# Patient Record
Sex: Male | Born: 1955 | Race: White | Hispanic: No | Marital: Married
Health system: Midwestern US, Academic
[De-identification: ages and names within clinical notes are randomized; demographics above are authoritative.]

---

## 2018-01-22 ENCOUNTER — Encounter: Admit: 2018-01-22 | Discharge: 2018-01-22 | Payer: MEDICARE

## 2018-01-22 DIAGNOSIS — I340 Nonrheumatic mitral (valve) insufficiency: Principal | ICD-10-CM

## 2018-01-22 MED ORDER — CARVEDILOL 3.125 MG PO TAB
ORAL_TABLET | Freq: Two times a day (BID) | ORAL | 3 refills | 90.00000 days | Status: AC
Start: 2018-01-22 — End: 2020-07-06

## 2018-01-28 ENCOUNTER — Encounter: Admit: 2018-01-28 | Discharge: 2018-01-28 | Payer: MEDICARE

## 2018-01-28 MED ORDER — DOXYCYCLINE MONOHYDRATE 100 MG PO CAP
ORAL_CAPSULE | Freq: Two times a day (BID) | 2 refills | 10.00000 days | Status: AC
Start: 2018-01-28 — End: 2018-09-18

## 2018-03-14 ENCOUNTER — Encounter: Admit: 2018-03-14 | Discharge: 2018-03-14 | Payer: MEDICARE

## 2018-03-14 DIAGNOSIS — R7881 Bacteremia: ICD-10-CM

## 2018-03-14 DIAGNOSIS — K7290 Hepatic failure, unspecified without coma: Principal | ICD-10-CM

## 2018-03-14 DIAGNOSIS — M009 Pyogenic arthritis, unspecified: ICD-10-CM

## 2018-03-14 DIAGNOSIS — F1011 Alcohol abuse, in remission: ICD-10-CM

## 2018-03-14 DIAGNOSIS — M1990 Unspecified osteoarthritis, unspecified site: ICD-10-CM

## 2018-07-27 ENCOUNTER — Encounter: Admit: 2018-07-27 | Discharge: 2018-07-27 | Payer: MEDICARE

## 2018-09-17 ENCOUNTER — Encounter: Admit: 2018-09-17 | Discharge: 2018-09-17 | Payer: MEDICARE

## 2018-09-18 MED ORDER — DOXYCYCLINE MONOHYDRATE 100 MG PO CAP
ORAL_CAPSULE | Freq: Two times a day (BID) | 8 refills | 10.00000 days | Status: AC
Start: 2018-09-18 — End: 2019-09-23

## 2018-10-18 ENCOUNTER — Encounter: Admit: 2018-10-18 | Discharge: 2018-10-18 | Payer: MEDICARE

## 2018-10-18 DIAGNOSIS — I340 Nonrheumatic mitral (valve) insufficiency: Principal | ICD-10-CM

## 2018-10-21 MED ORDER — CARVEDILOL 3.125 MG PO TAB
ORAL_TABLET | Freq: Two times a day (BID) | 3 refills
Start: 2018-10-21 — End: ?

## 2018-11-16 ENCOUNTER — Encounter: Admit: 2018-11-16 | Discharge: 2018-11-16 | Payer: MEDICARE

## 2018-11-16 ENCOUNTER — Inpatient Hospital Stay: Admit: 2018-11-16 | Discharge: 2018-11-16 | Payer: MEDICARE

## 2018-11-16 DIAGNOSIS — K922 Gastrointestinal hemorrhage, unspecified: ICD-10-CM

## 2018-11-16 LAB — URINALYSIS DIPSTICK REFLEX TO CULTURE
Lab: NEGATIVE
Lab: NEGATIVE
Lab: NEGATIVE
Lab: NEGATIVE
Lab: NEGATIVE
Lab: NEGATIVE

## 2018-11-16 LAB — COMPREHENSIVE METABOLIC PANEL
Lab: 1 mg/dL (ref 0.4–1.24)
Lab: 1.7 mg/dL — ABNORMAL HIGH (ref 0.3–1.2)
Lab: 112 MMOL/L — ABNORMAL HIGH (ref 98–110)
Lab: 14 — ABNORMAL HIGH (ref 3–12)
Lab: 144 MMOL/L (ref 137–147)
Lab: 18 MMOL/L — ABNORMAL LOW (ref 21–30)
Lab: 18 U/L (ref 7–56)
Lab: 2.1 g/dL — ABNORMAL LOW (ref 3.5–5.0)
Lab: 24 U/L (ref 7–40)
Lab: 3.5 MMOL/L (ref 3.5–5.1)
Lab: 3.7 g/dL — ABNORMAL LOW (ref 6.0–8.0)
Lab: 7.2 mg/dL — ABNORMAL LOW (ref 8.5–10.6)
Lab: 84 U/L (ref 25–110)
Lab: >60 mL/min (ref >60)
Lab: >60 mL/min (ref >60)

## 2018-11-16 LAB — CBC AND DIFF
Lab: 0 % (ref 0–2)
Lab: 0 % (ref 0–5)
Lab: 0 10*3/uL (ref 0–0.20)
Lab: 0 10*3/uL (ref 0–0.45)
Lab: 0.9 10*3/uL — ABNORMAL HIGH (ref 0–0.80)
Lab: 1 10*3/uL (ref 1.0–4.8)
Lab: 14 10*3/uL — ABNORMAL HIGH (ref 1.8–7.0)
Lab: 16 K/UL — ABNORMAL HIGH (ref 4.5–11.0)
Lab: 169 10*3/uL (ref 150–400)
Lab: 17 % — ABNORMAL HIGH (ref 11–15)
Lab: 2.6 M/UL — ABNORMAL LOW (ref 4.4–5.5)
Lab: 24 % — ABNORMAL LOW (ref 40–50)
Lab: 32 pg (ref 26–34)
Lab: 6 % (ref 4–12)
Lab: 6 % — ABNORMAL LOW (ref 24–44)
Lab: 7.5 FL (ref 7–11)
Lab: 88 % — ABNORMAL HIGH (ref 41–77)

## 2018-11-16 LAB — PROTIME INR (PT): Lab: 1.7 — ABNORMAL HIGH (ref 0.8–1.2)

## 2018-11-16 LAB — MAGNESIUM: Lab: 1.7 mg/dL — ABNORMAL HIGH (ref 1.6–2.6)

## 2018-11-16 LAB — BLOOD GASES, PERIPHERAL VENOUS
Lab: 1 MMOL/L
Lab: 16 mmHg — ABNORMAL LOW (ref 33–48)
Lab: 25 mmHg — ABNORMAL LOW (ref 36–50)
Lab: 7.5 — ABNORMAL HIGH (ref 7.30–7.40)

## 2018-11-16 LAB — PTT (APTT): Lab: 33 s (ref 24.0–36.5)

## 2018-11-16 LAB — URINALYSIS MICROSCOPIC REFLEX TO CULTURE

## 2018-11-16 LAB — TROPONIN-I: Lab: 0 ng/mL (ref 0.0–0.05)

## 2018-11-16 LAB — PHOSPHORUS: Lab: 2.7 mg/dL — ABNORMAL HIGH (ref 2.0–4.5)

## 2018-11-16 LAB — AMMONIA: Lab: 48 umol/L — ABNORMAL HIGH (ref 9–35)

## 2018-11-16 LAB — LACTIC ACID (BG - RAPID LACTATE): Lab: 4.6 MMOL/L — ABNORMAL HIGH (ref 0.5–2.0)

## 2018-11-16 LAB — PROCALCITONIN: Lab: 0.2 ng/mL — ABNORMAL HIGH (ref <0.11)

## 2018-11-16 MED ORDER — PHYTONADIONE IVPB
10 mg | Freq: Every day | INTRAVENOUS | 0 refills | Status: CP
Start: 2018-11-16 — End: ?
  Administered 2018-11-16 – 2018-11-18 (×6): 10 mg via INTRAVENOUS

## 2018-11-16 MED ORDER — PANTOPRAZOLE IV INFUSION
8 mg/h | INTRAVENOUS | 0 refills | Status: AC
Start: 2018-11-16 — End: ?
  Administered 2018-11-16 – 2018-11-19 (×14): 8 mg/h via INTRAVENOUS

## 2018-11-16 MED ORDER — LACTATED RINGERS IV SOLP
500 mL | INTRAVENOUS | 0 refills | Status: CP
Start: 2018-11-16 — End: ?
  Administered 2018-11-16: 23:00:00 500 mL via INTRAVENOUS

## 2018-11-16 MED ORDER — POTASSIUM CHLORIDE IN WATER 10 MEQ/50 ML IV PGBK
10 meq | INTRAVENOUS | 0 refills | Status: CP
Start: 2018-11-16 — End: ?
  Administered 2018-11-17 (×4): 10 meq via INTRAVENOUS

## 2018-11-16 MED ORDER — ALBUMIN, HUMAN 25 % IV SOLP
25 g | INTRAVENOUS | 0 refills | Status: CP
Start: 2018-11-16 — End: ?
  Administered 2018-11-17 (×4): 25 g via INTRAVENOUS

## 2018-11-16 MED ORDER — BANANA BAG (~~LOC~~) 1000ML
Freq: Every day | INTRAVENOUS | 0 refills | Status: CP
Start: 2018-11-16 — End: ?
  Administered 2018-11-17 – 2018-11-18 (×12): 1001.200 mL via INTRAVENOUS

## 2018-11-16 MED ORDER — LACTULOSE 10 GRAM/15 ML PO SOLN
20 g | Freq: Three times a day (TID) | ORAL | 0 refills | Status: DC
Start: 2018-11-16 — End: 2018-11-22
  Administered 2018-11-17 – 2018-11-22 (×2): 20 g via ORAL

## 2018-11-16 MED ORDER — PANTOPRAZOLE 40 MG IV SOLR
80 mg | INTRAVENOUS | 0 refills | Status: CP
Start: 2018-11-16 — End: ?
  Administered 2018-11-16: 22:00:00 80 mg via INTRAVENOUS

## 2018-11-16 MED ORDER — OCTREOTIDE IV DRIP
50 ug/h | INTRAVENOUS | 0 refills | Status: DC
Start: 2018-11-16 — End: 2018-11-17
  Administered 2018-11-16 – 2018-11-17 (×6): 50 ug/h via INTRAVENOUS

## 2018-11-16 MED ORDER — FOLIC ACID 1 MG PO TAB
1 mg | Freq: Every day | ORAL | 0 refills | Status: DC
Start: 2018-11-16 — End: 2018-11-22
  Administered 2018-11-19 – 2018-11-22 (×4): 1 mg via ORAL

## 2018-11-16 MED ORDER — OCTREOTIDE (SANDOSTATIN) BOLUS FOR CONTINUOUS INFUSION
50 ug | Freq: Once | INTRAVENOUS | 0 refills | Status: CP
Start: 2018-11-16 — End: ?

## 2018-11-16 MED ORDER — CEFTRIAXONE INJ 1GM IVP
1 g | INTRAVENOUS | 0 refills | Status: DC
Start: 2018-11-16 — End: 2018-11-17
  Administered 2018-11-16: 22:00:00 1 g via INTRAVENOUS

## 2018-11-16 MED ORDER — THIAMINE MONONITRATE (VIT B1) 100 MG PO TAB
100 mg | Freq: Every day | ORAL | 0 refills | Status: DC
Start: 2018-11-16 — End: 2018-11-21
  Administered 2018-11-19 – 2018-11-21 (×3): 100 mg via ORAL

## 2018-11-16 MED ORDER — RIFAXIMIN 550 MG PO TAB
550 mg | Freq: Two times a day (BID) | ORAL | 0 refills | Status: DC
Start: 2018-11-16 — End: 2018-11-16

## 2018-11-17 ENCOUNTER — Encounter: Admit: 2018-11-17 | Discharge: 2018-11-17 | Payer: MEDICARE

## 2018-11-17 ENCOUNTER — Inpatient Hospital Stay: Admit: 2018-11-17 | Discharge: 2018-11-17 | Payer: MEDICARE

## 2018-11-17 DIAGNOSIS — K922 Gastrointestinal hemorrhage, unspecified: ICD-10-CM

## 2018-11-17 LAB — PROTIME INR (PT): Lab: 2 mg/dL — ABNORMAL HIGH (ref <150)

## 2018-11-17 LAB — HEMOGLOBIN & HEMATOCRIT
Lab: 14 % — ABNORMAL LOW (ref 40–50)
Lab: 5 g/dL — CL (ref 13.5–16.5)

## 2018-11-17 LAB — CBC
Lab: 13 K/UL — ABNORMAL HIGH (ref 4.5–11.0)
Lab: 2.1 M/UL — ABNORMAL LOW (ref 4.4–5.5)
Lab: 11 K/UL — ABNORMAL HIGH (ref 4.5–11.0)
Lab: 123 10*3/uL — ABNORMAL LOW (ref 150–400)
Lab: 16 % — ABNORMAL HIGH (ref 11–15)
Lab: 18 % — ABNORMAL LOW (ref 40–50)
Lab: 32 pg (ref 26–34)
Lab: 34 g/dL — ABNORMAL HIGH (ref 32.0–36.0)
Lab: 6.2 g/dL — ABNORMAL LOW (ref 13.5–16.5)
Lab: 7.8 FL (ref 7–11)
Lab: 94 FL (ref 80–100)
Lab: 10 K/UL — ABNORMAL LOW (ref 4.5–11.0)
Lab: 16 % — ABNORMAL HIGH (ref 11–15)
Lab: 19 % — ABNORMAL LOW (ref 40–50)
Lab: 2.1 M/UL — ABNORMAL LOW (ref 4.4–5.5)
Lab: 32 pg (ref 26–34)
Lab: 35 g/dL — ABNORMAL HIGH (ref 32.0–36.0)
Lab: 7.1 g/dL — ABNORMAL LOW (ref 13.5–16.5)
Lab: 7.8 FL (ref 7–11)
Lab: 91 FL (ref 80–100)
Lab: 99 K/UL — ABNORMAL LOW (ref 150–400)
Lab: 11 K/UL — ABNORMAL HIGH (ref >60)
Lab: 15 % — ABNORMAL LOW (ref >60)
Lab: 5.5 g/dL — CL (ref >60)

## 2018-11-17 LAB — MAGNESIUM: Lab: 1.7 mg/dL — ABNORMAL LOW (ref >60)

## 2018-11-17 LAB — BASIC METABOLIC PANEL: Lab: 148 MMOL/L — ABNORMAL HIGH (ref >60)

## 2018-11-17 LAB — LACTIC ACID (BG - RAPID LACTATE)
Lab: 3.2 MMOL/L — ABNORMAL HIGH (ref 0.5–2.0)
Lab: 4.5 MMOL/L — ABNORMAL HIGH (ref >40)
Lab: 4.1 MMOL/L — ABNORMAL HIGH (ref 0.5–2.0)
Lab: 4.5 MMOL/L — ABNORMAL HIGH (ref 0.5–2.0)
Lab: 2.7 MMOL/L — ABNORMAL HIGH (ref 0.5–2.0)

## 2018-11-17 LAB — TROPONIN-I: Lab: 0 ng/mL — ABNORMAL LOW (ref 0.0–0.05)

## 2018-11-17 LAB — PHOSPHORUS: Lab: 3.1 mg/dL — ABNORMAL LOW (ref 2.0–4.5)

## 2018-11-17 LAB — COMPREHENSIVE METABOLIC PANEL: Lab: 147 MMOL/L — ABNORMAL HIGH (ref <100)

## 2018-11-17 LAB — IONIZED CALCIUM: Lab: 0.9 MMOL/L — ABNORMAL LOW (ref 1.0–1.3)

## 2018-11-17 MED ORDER — ERYTHROMYCIN 250MG IVPB
250 mg | Freq: Once | INTRAVENOUS | 0 refills | Status: CP
Start: 2018-11-17 — End: ?
  Administered 2018-11-17 (×2): 250 mg via INTRAVENOUS

## 2018-11-17 MED ORDER — SODIUM CHLORIDE 0.45 % IV SOLP
1000 mL | INTRAVENOUS | 0 refills | Status: DC
Start: 2018-11-17 — End: 2018-11-18
  Administered 2018-11-17 – 2018-11-18 (×3): 1000 mL via INTRAVENOUS

## 2018-11-17 MED ORDER — LORAZEPAM 2 MG/ML IJ SOLN
1-2 mg | INTRAVENOUS | 0 refills | Status: DC | PRN
Start: 2018-11-17 — End: 2018-11-21
  Administered 2018-11-17: 15:00:00 1 mg via INTRAVENOUS
  Administered 2018-11-19: 08:00:00 2 mg via INTRAVENOUS

## 2018-11-17 MED ORDER — FLUMAZENIL 0.1 MG/ML IV SOLN
.2 mg | INTRAVENOUS | 0 refills | Status: DC | PRN
Start: 2018-11-17 — End: 2018-11-22

## 2018-11-17 MED ORDER — MAGNESIUM SULFATE IN D5W 1 GRAM/100 ML IV PGBK
1 g | Freq: Once | INTRAVENOUS | 0 refills | Status: CP
Start: 2018-11-17 — End: ?
  Administered 2018-11-17: 15:00:00 1 g via INTRAVENOUS

## 2018-11-17 MED ORDER — FENTANYL CITRATE (PF) 50 MCG/ML IJ SOLN
25-50 ug | INTRAVENOUS | 0 refills | Status: AC | PRN
Start: 2018-11-17 — End: ?

## 2018-11-17 MED ORDER — FENTANYL CITRATE (PF) 50 MCG/ML IJ SOLN
50 ug | Freq: Once | INTRAVENOUS | 0 refills | Status: CP
Start: 2018-11-17 — End: ?
  Administered 2018-11-18: 05:00:00 25 ug via INTRAVENOUS

## 2018-11-17 MED ORDER — DOXYCYCLINE HYCLATE 100 MG PO TAB
100 mg | Freq: Two times a day (BID) | ORAL | 0 refills | Status: DC
Start: 2018-11-17 — End: 2018-11-22
  Administered 2018-11-17 – 2018-11-22 (×11): 100 mg via ORAL

## 2018-11-17 MED ORDER — MIDAZOLAM 1 MG/ML IJ SOLN
1-2 mg | INTRAVENOUS | 0 refills | Status: DC | PRN
Start: 2018-11-17 — End: 2018-11-17

## 2018-11-17 MED ORDER — LACTATED RINGERS IV SOLP
1000 mL | INTRAVENOUS | 0 refills | Status: CP
Start: 2018-11-17 — End: ?
  Administered 2018-11-17: 10:00:00 1000 mL via INTRAVENOUS

## 2018-11-17 MED ORDER — NALOXONE 0.4 MG/ML IJ SOLN
.08 mg | INTRAVENOUS | 0 refills | Status: DC | PRN
Start: 2018-11-17 — End: 2018-11-20

## 2018-11-17 MED ORDER — CALCIUM GLUCONATE 1GM/100ML NS MB+
1 g | Freq: Once | INTRAVENOUS | 0 refills | Status: CP
Start: 2018-11-17 — End: ?
  Administered 2018-11-17 (×2): 1 g via INTRAVENOUS

## 2018-11-17 MED ORDER — MIDAZOLAM 1 MG/ML IJ SOLN
1 mg | Freq: Once | INTRAVENOUS | 0 refills | Status: CP
Start: 2018-11-17 — End: ?
  Administered 2018-11-18: 05:00:00 1 mg via INTRAVENOUS

## 2018-11-17 MED ORDER — FENTANYL CITRATE (PF) 50 MCG/ML IJ SOLN
0 refills | Status: CP
Start: 2018-11-17 — End: ?
  Administered 2018-11-17 (×2): 25 ug via INTRAVENOUS

## 2018-11-17 MED ORDER — IOHEXOL 350 MG IODINE/ML IV SOLN
100 mL | Freq: Once | INTRAVENOUS | 0 refills | Status: CP
Start: 2018-11-17 — End: ?
  Administered 2018-11-18: 100 mL via INTRAVENOUS

## 2018-11-17 MED ORDER — POTASSIUM CHLORIDE IN WATER 10 MEQ/50 ML IV PGBK
10 meq | INTRAVENOUS | 0 refills | Status: CP
Start: 2018-11-17 — End: ?
  Administered 2018-11-17 (×2): 10 meq via INTRAVENOUS

## 2018-11-17 MED ORDER — SODIUM CHLORIDE 0.9 % IJ SOLN
50 mL | Freq: Once | INTRAVENOUS | 0 refills | Status: CP
Start: 2018-11-17 — End: ?
  Administered 2018-11-18: 50 mL via INTRAVENOUS

## 2018-11-17 MED ORDER — MIDAZOLAM 1 MG/ML IJ SOLN
0 refills | Status: CP
Start: 2018-11-17 — End: ?
  Administered 2018-11-18: 05:00:00 1 mg via INTRAVENOUS

## 2018-11-17 MED ORDER — MIDAZOLAM 1 MG/ML IJ SOLN
0 refills | Status: CP
Start: 2018-11-17 — End: ?
  Administered 2018-11-17 (×2): 2 mg via INTRAVENOUS
  Administered 2018-11-17: 17:00:00 1 mg via INTRAVENOUS

## 2018-11-17 MED ORDER — FENTANYL CITRATE (PF) 50 MCG/ML IJ SOLN
0 refills | Status: CP
Start: 2018-11-17 — End: ?
  Administered 2018-11-18: 05:00:00 25 ug via INTRAVENOUS

## 2018-11-17 MED ORDER — SODIUM CHLORIDE 0.9 % IV SOLP
INTRAVENOUS | 0 refills | Status: DC
Start: 2018-11-17 — End: 2018-11-17

## 2018-11-18 ENCOUNTER — Encounter: Admit: 2018-11-18 | Discharge: 2018-11-18 | Payer: MEDICARE

## 2018-11-18 LAB — CBC
Lab: 11 K/UL — ABNORMAL HIGH (ref 4.5–11.0)
Lab: 18 % — ABNORMAL LOW (ref 40–50)
Lab: 30 pg — ABNORMAL HIGH (ref 26–34)
Lab: 6.3 g/dL — ABNORMAL LOW (ref 13.5–16.5)
Lab: 89 FL (ref 80–100)
Lab: 1.6 M/UL — ABNORMAL LOW (ref 4.4–5.5)
Lab: 9 10*3/uL (ref 4.5–11.0)
Lab: 14 K/UL — ABNORMAL HIGH (ref >60)
Lab: 16 % — ABNORMAL HIGH (ref 11–15)
Lab: 2.5 M/UL — ABNORMAL LOW (ref >60)
Lab: 22 % — ABNORMAL LOW (ref 40–50)
Lab: 31 pg (ref 26–34)
Lab: 35 g/dL (ref 32.0–36.0)
Lab: 7.5 FL (ref 7–11)
Lab: 77 10*3/uL — ABNORMAL LOW (ref 150–400)
Lab: 8 g/dL — ABNORMAL LOW (ref 13.5–16.5)
Lab: 88 FL (ref 80–100)
Lab: 19 K/UL — ABNORMAL HIGH (ref 4.5–11.0)
Lab: 2.8 M/UL — ABNORMAL LOW (ref 4.4–5.5)

## 2018-11-18 LAB — LACTIC ACID (BG - RAPID LACTATE)
Lab: 1.7 MMOL/L — ABNORMAL LOW (ref 0.5–2.0)
Lab: 1.4 MMOL/L — ABNORMAL LOW (ref 0.5–2.0)

## 2018-11-18 LAB — H. PYLORI AG, FECES: Lab: POSITIVE — AB

## 2018-11-18 LAB — MAGNESIUM: Lab: 1.7 mg/dL — ABNORMAL LOW (ref 1.6–2.6)

## 2018-11-18 LAB — COMPREHENSIVE METABOLIC PANEL
Lab: 0.9 mg/dL (ref 0.4–1.24)
Lab: 1.9 g/dL — ABNORMAL LOW (ref 3.5–5.0)
Lab: 118 mg/dL — ABNORMAL HIGH (ref 70–100)
Lab: 135 MMOL/L — ABNORMAL LOW (ref 137–147)
Lab: 15 MMOL/L — ABNORMAL LOW (ref 21–30)
Lab: 18 U/L (ref 7–40)
Lab: 2.5 g/dL — ABNORMAL LOW (ref 6.0–8.0)
Lab: 3.4 mg/dL — ABNORMAL HIGH (ref 0.3–1.2)
Lab: 38 mg/dL — ABNORMAL HIGH (ref 7–25)
Lab: 42 U/L (ref 25–110)
Lab: 5.4 mg/dL — CL (ref 8.5–10.6)
Lab: 7 (ref 3–12)
Lab: 9 U/L (ref 7–56)
Lab: >60 mL/min (ref >60)
Lab: >60 mL/min (ref >60)

## 2018-11-18 LAB — IONIZED CALCIUM
Lab: 1.1 MMOL/L (ref 1.0–1.3)
Lab: 1.1 MMOL/L — ABNORMAL LOW (ref >60)

## 2018-11-18 LAB — ALPHA FETO PROTEIN (AFP): Lab: 1.1 ng/mL (ref >60)

## 2018-11-18 LAB — PROTIME INR (PT): Lab: 1.9 MMOL/L — ABNORMAL HIGH (ref 0.8–1.2)

## 2018-11-18 LAB — CBC AND DIFF
Lab: 11 K/UL — ABNORMAL HIGH (ref 4.5–11.0)
Lab: 2.3 M/UL — ABNORMAL LOW (ref 4.4–5.5)
Lab: 7.2 g/dL — ABNORMAL LOW (ref 13.5–16.5)

## 2018-11-18 LAB — BASIC METABOLIC PANEL
Lab: 140 MMOL/L — ABNORMAL LOW (ref >60)
Lab: 4 MMOL/L — ABNORMAL LOW (ref >60)

## 2018-11-18 LAB — PHOSPHORUS: Lab: 2.7 mg/dL — ABNORMAL HIGH (ref 2.0–4.5)

## 2018-11-18 MED ORDER — CEFTRIAXONE INJ 1GM IVP
1 g | INTRAVENOUS | 0 refills | Status: DC
Start: 2018-11-18 — End: 2018-11-19
  Administered 2018-11-18 – 2018-11-19 (×2): 1 g via INTRAVENOUS

## 2018-11-18 MED ORDER — MAGNESIUM SULFATE IN D5W 1 GRAM/100 ML IV PGBK
1 g | INTRAVENOUS | 0 refills | Status: CP
Start: 2018-11-18 — End: ?
  Administered 2018-11-18 (×2): 1 g via INTRAVENOUS

## 2018-11-18 MED ORDER — CALCIUM GLUCONATE IVPB (NON-STD)
2 g | Freq: Once | INTRAVENOUS | 0 refills | Status: CP
Start: 2018-11-18 — End: ?
  Administered 2018-11-18 (×2): 2 g via INTRAVENOUS

## 2018-11-18 MED ORDER — ONDANSETRON HCL (PF) 4 MG/2 ML IJ SOLN
4 mg | INTRAVENOUS | 0 refills | Status: DC | PRN
Start: 2018-11-18 — End: 2018-11-22
  Administered 2018-11-18 – 2018-11-19 (×2): 4 mg via INTRAVENOUS

## 2018-11-18 MED ORDER — MELATONIN 5 MG PO TAB
5 mg | Freq: Once | ORAL | 0 refills | Status: CP
Start: 2018-11-18 — End: ?
  Administered 2018-11-19: 06:00:00 5 mg via ORAL

## 2018-11-18 MED ORDER — RIFAXIMIN 550 MG PO TAB
550 mg | ORAL_TABLET | Freq: Two times a day (BID) | ORAL | 0 refills | 30.00000 days | Status: AC
Start: 2018-11-18 — End: 2018-11-22

## 2018-11-18 MED ORDER — IOPAMIDOL 61 % IV SOLN
125 mL | Freq: Once | INTRA_ARTERIAL | 0 refills | Status: CP
Start: 2018-11-18 — End: ?
  Administered 2018-11-18: 07:00:00 125 mL via INTRA_ARTERIAL

## 2018-11-18 MED ORDER — POTASSIUM CHLORIDE IN WATER 10 MEQ/50 ML IV PGBK
10 meq | INTRAVENOUS | 0 refills | Status: CP
Start: 2018-11-18 — End: ?
  Administered 2018-11-18 (×5): 10 meq via INTRAVENOUS

## 2018-11-19 ENCOUNTER — Encounter: Admit: 2018-11-19 | Discharge: 2018-11-19 | Payer: MEDICARE

## 2018-11-19 DIAGNOSIS — F1011 Alcohol abuse, in remission: ICD-10-CM

## 2018-11-19 DIAGNOSIS — A048 Other specified bacterial intestinal infections: Principal | ICD-10-CM

## 2018-11-19 DIAGNOSIS — K7030 Alcoholic cirrhosis of liver without ascites: ICD-10-CM

## 2018-11-19 DIAGNOSIS — M009 Pyogenic arthritis, unspecified: ICD-10-CM

## 2018-11-19 DIAGNOSIS — M1990 Unspecified osteoarthritis, unspecified site: ICD-10-CM

## 2018-11-19 DIAGNOSIS — K269 Duodenal ulcer, unspecified as acute or chronic, without hemorrhage or perforation: ICD-10-CM

## 2018-11-19 DIAGNOSIS — K7290 Hepatic failure, unspecified without coma: Principal | ICD-10-CM

## 2018-11-19 DIAGNOSIS — K209 Esophagitis, unspecified: ICD-10-CM

## 2018-11-19 DIAGNOSIS — R7881 Bacteremia: ICD-10-CM

## 2018-11-19 LAB — BASIC METABOLIC PANEL
Lab: 1.1 mg/dL (ref 0.4–1.24)
Lab: 115 MMOL/L — ABNORMAL HIGH (ref 98–110)
Lab: 134 mg/dL — ABNORMAL HIGH (ref 70–100)
Lab: 138 MMOL/L (ref 137–147)
Lab: 17 MMOL/L — ABNORMAL LOW (ref 21–30)
Lab: 46 mg/dL — ABNORMAL HIGH (ref 7–25)
Lab: 6 (ref 3–12)
Lab: 7.4 mg/dL — ABNORMAL LOW (ref 8.5–10.6)
Lab: >60 mL/min (ref >60)
Lab: >60 mL/min (ref >60)

## 2018-11-19 LAB — PROTIME INR (PT): Lab: 1.4 g/dL — ABNORMAL HIGH (ref >60)

## 2018-11-19 LAB — MAGNESIUM
Lab: 2.3 mg/dL (ref 1.6–2.6)
Lab: 2.2 mg/dL — ABNORMAL LOW (ref >60)

## 2018-11-19 LAB — PHOSPHORUS: Lab: 2.8 mg/dL — ABNORMAL LOW (ref 2.0–4.5)

## 2018-11-19 LAB — CBC AND DIFF: Lab: 20 10*3/uL — ABNORMAL HIGH (ref 4.5–11.0)

## 2018-11-19 LAB — COMPREHENSIVE METABOLIC PANEL: Lab: 138 MMOL/L (ref 137–147)

## 2018-11-19 MED ORDER — CARVEDILOL 3.125 MG PO TAB
3.125 mg | Freq: Two times a day (BID) | ORAL | 0 refills | Status: DC
Start: 2018-11-19 — End: 2018-11-20
  Administered 2018-11-20: 02:00:00 3.125 mg via ORAL

## 2018-11-19 MED ORDER — CEFTRIAXONE INJ 1GM IVP
1 g | INTRAVENOUS | 0 refills | Status: DC
Start: 2018-11-19 — End: 2018-11-20

## 2018-11-19 MED ORDER — ALBUMIN, HUMAN 25 % IV SOLP
25 g | INTRAVENOUS | 0 refills | Status: CP
Start: 2018-11-19 — End: ?
  Administered 2018-11-19 – 2018-11-20 (×4): 25 g via INTRAVENOUS

## 2018-11-19 MED ORDER — PANTOPRAZOLE 40 MG IV SOLR
40 mg | Freq: Two times a day (BID) | INTRAVENOUS | 0 refills | Status: DC
Start: 2018-11-19 — End: 2018-11-22
  Administered 2018-11-20 – 2018-11-22 (×6): 40 mg via INTRAVENOUS

## 2018-11-19 MED ORDER — SODIUM CHLORIDE 0.9 % IV SOLP
INTRAVENOUS | 0 refills | Status: CN
Start: 2018-11-19 — End: ?

## 2018-11-19 MED ORDER — RIFAXIMIN 550 MG PO TAB
550 mg | Freq: Two times a day (BID) | ORAL | 0 refills | Status: DC
Start: 2018-11-19 — End: 2018-11-22
  Administered 2018-11-19 – 2018-11-22 (×6): 550 mg via ORAL

## 2018-11-20 ENCOUNTER — Ambulatory Visit: Admit: 2018-11-20 | Discharge: 2018-11-20 | Payer: MEDICARE

## 2018-11-20 ENCOUNTER — Encounter: Admit: 2018-11-20 | Discharge: 2018-11-20 | Payer: MEDICARE

## 2018-11-20 ENCOUNTER — Inpatient Hospital Stay: Admit: 2018-11-16 | Discharge: 2018-11-16 | Payer: MEDICARE

## 2018-11-20 DIAGNOSIS — K209 Esophagitis, unspecified: ICD-10-CM

## 2018-11-20 DIAGNOSIS — K269 Duodenal ulcer, unspecified as acute or chronic, without hemorrhage or perforation: ICD-10-CM

## 2018-11-20 DIAGNOSIS — K922 Gastrointestinal hemorrhage, unspecified: Secondary | ICD-10-CM

## 2018-11-20 LAB — COMPREHENSIVE METABOLIC PANEL: Lab: 137 MMOL/L — ABNORMAL HIGH (ref 137–147)

## 2018-11-20 LAB — C DIFFICILE BY PCR: Lab: NEGATIVE

## 2018-11-20 LAB — CBC
Lab: 14 K/UL — ABNORMAL HIGH (ref 4.5–11.0)
Lab: 17 % — ABNORMAL HIGH (ref >60)
Lab: 2.2 M/UL — ABNORMAL LOW (ref 4.4–5.5)
Lab: 20 % — ABNORMAL LOW (ref 40–50)
Lab: 31 pg — ABNORMAL LOW (ref >60)
Lab: 33 g/dL — ABNORMAL HIGH (ref >60)
Lab: 59 K/UL — ABNORMAL LOW (ref >60)
Lab: 7.1 g/dL — ABNORMAL LOW (ref 13.5–16.5)
Lab: 8.3 FL (ref 7–11)
Lab: 92 FL — ABNORMAL LOW (ref 80–100)

## 2018-11-20 LAB — PROTIME INR (PT): Lab: 1.4 MMOL/L — ABNORMAL HIGH (ref 0.8–1.2)

## 2018-11-20 LAB — PHOSPHORUS: Lab: 2.7 mg/dL — ABNORMAL HIGH (ref 2.0–4.5)

## 2018-11-20 LAB — HEMOGLOBIN: Lab: 6.7 g/dL — ABNORMAL LOW (ref 13.5–16.5)

## 2018-11-20 LAB — CBC AND DIFF: Lab: 14 K/UL — ABNORMAL HIGH (ref >60)

## 2018-11-20 LAB — MAGNESIUM: Lab: 2.3 mg/dL — ABNORMAL LOW (ref >60)

## 2018-11-20 MED ORDER — MELATONIN 1 MG PO TAB
1 mg | Freq: Every evening | ORAL | 0 refills | Status: DC
Start: 2018-11-20 — End: 2018-11-22
  Administered 2018-11-21 – 2018-11-22 (×2): 1 mg via ORAL

## 2018-11-20 MED ORDER — POTASSIUM CHLORIDE 20 MEQ PO TBTQ
40 meq | Freq: Once | ORAL | 0 refills | Status: CP
Start: 2018-11-20 — End: ?
  Administered 2018-11-20: 11:00:00 40 meq via ORAL

## 2018-11-20 MED ORDER — FUROSEMIDE 10 MG/ML IJ SOLN
40 mg | Freq: Once | INTRAVENOUS | 0 refills | Status: CP
Start: 2018-11-20 — End: ?
  Administered 2018-11-20: 21:00:00 40 mg via INTRAVENOUS

## 2018-11-20 MED ORDER — MELATONIN 3 MG PO TAB
3 mg | Freq: Every evening | ORAL | 0 refills | Status: DC | PRN
Start: 2018-11-20 — End: 2018-11-22
  Administered 2018-11-21 – 2018-11-22 (×2): 3 mg via ORAL

## 2018-11-20 MED ORDER — CLARITHROMYCIN 500 MG PO TAB
500 mg | Freq: Two times a day (BID) | ORAL | 0 refills | Status: DC
Start: 2018-11-20 — End: 2018-11-22
  Administered 2018-11-20 – 2018-11-22 (×5): 500 mg via ORAL

## 2018-11-20 MED ORDER — AMOXICILLIN 500 MG PO CAP
1000 mg | Freq: Two times a day (BID) | ORAL | 0 refills | Status: DC
Start: 2018-11-20 — End: 2018-11-22
  Administered 2018-11-20 – 2018-11-22 (×5): 1000 mg via ORAL

## 2018-11-21 LAB — RETICULOCYTE COUNT
Lab: 11 % — ABNORMAL HIGH (ref 0.5–2.0)
Lab: 267 K/UL — ABNORMAL HIGH (ref 30–94)
Lab: 5.5 % (ref 11–15)

## 2018-11-21 LAB — CBC AND DIFF
Lab: 11 K/UL — ABNORMAL HIGH (ref 4.5–11.0)
Lab: 19 % — ABNORMAL LOW (ref >60)
Lab: 2.1 M/UL — ABNORMAL LOW (ref 4.4–5.5)
Lab: 6.6 g/dL — ABNORMAL LOW (ref >60)

## 2018-11-21 LAB — MAGNESIUM: Lab: 2.3 mg/dL — ABNORMAL HIGH (ref 1.6–2.6)

## 2018-11-21 LAB — COMPREHENSIVE METABOLIC PANEL: Lab: 139 MMOL/L (ref 137–147)

## 2018-11-21 LAB — PHOSPHORUS: Lab: 2.6 mg/dL — ABNORMAL LOW (ref 2.0–4.5)

## 2018-11-21 LAB — PROTIME INR (PT): Lab: 1.3 mg/dL — ABNORMAL HIGH (ref >60)

## 2018-11-21 MED ORDER — FUROSEMIDE 10 MG/ML IJ SOLN
60 mg | Freq: Once | INTRAVENOUS | 0 refills | Status: CP
Start: 2018-11-21 — End: ?
  Administered 2018-11-21: 18:00:00 60 mg via INTRAVENOUS

## 2018-11-21 MED ORDER — TRAZODONE 50 MG PO TAB
50 mg | Freq: Every evening | ORAL | 0 refills | Status: DC | PRN
Start: 2018-11-21 — End: 2018-11-22
  Administered 2018-11-22: 04:00:00 50 mg via ORAL

## 2018-11-21 MED ORDER — CARVEDILOL 3.125 MG PO TAB
3.125 mg | Freq: Two times a day (BID) | ORAL | 0 refills | Status: DC
Start: 2018-11-21 — End: 2018-11-22
  Administered 2018-11-21 – 2018-11-22 (×3): 3.125 mg via ORAL

## 2018-11-21 MED ORDER — FUROSEMIDE 10 MG/ML IJ SOLN
80 mg | Freq: Once | INTRAVENOUS | 0 refills | Status: CP
Start: 2018-11-21 — End: ?
  Administered 2018-11-21: 80 mg via INTRAVENOUS

## 2018-11-21 MED ORDER — ALBUMIN, HUMAN 25 % IV SOLP
25 g | Freq: Once | INTRAVENOUS | 0 refills | Status: CP
Start: 2018-11-21 — End: ?
  Administered 2018-11-21: 25 g via INTRAVENOUS

## 2018-11-21 MED ORDER — POTASSIUM CHLORIDE 20 MEQ PO TBTQ
60 meq | ORAL | 0 refills | Status: CP
Start: 2018-11-21 — End: ?
  Administered 2018-11-21 (×2): 60 meq via ORAL

## 2018-11-21 MED ORDER — FUROSEMIDE 10 MG/ML IJ SOLN
40 mg | Freq: Once | INTRAVENOUS | 0 refills | Status: DC
Start: 2018-11-21 — End: 2018-11-21

## 2018-11-21 MED ORDER — THIAMINE HCL (VITAMIN B1) 100 MG/ML IJ SOLN
100 mg | Freq: Every day | INTRAVENOUS | 0 refills | Status: DC
Start: 2018-11-21 — End: 2018-11-22
  Administered 2018-11-21 – 2018-11-22 (×2): 100 mg via INTRAVENOUS

## 2018-11-22 ENCOUNTER — Encounter: Admit: 2018-11-22 | Discharge: 2018-11-22 | Payer: MEDICARE

## 2018-11-22 ENCOUNTER — Inpatient Hospital Stay: Admit: 2018-11-17 | Discharge: 2018-11-17 | Payer: MEDICARE

## 2018-11-22 ENCOUNTER — Inpatient Hospital Stay
Admit: 2018-11-16 | Discharge: 2018-11-22 | Disposition: A | Discharge status: Home Health | Payer: MEDICARE | Source: Other Acute Inpatient Hospital

## 2018-11-22 ENCOUNTER — Inpatient Hospital Stay: Admit: 2018-11-20 | Discharge: 2018-11-20 | Payer: MEDICARE

## 2018-11-22 DIAGNOSIS — R578 Other shock: ICD-10-CM

## 2018-11-22 DIAGNOSIS — E872 Acidosis: ICD-10-CM

## 2018-11-22 DIAGNOSIS — B9681 Helicobacter pylori [H. pylori] as the cause of diseases classified elsewhere: ICD-10-CM

## 2018-11-22 DIAGNOSIS — J9601 Acute respiratory failure with hypoxia: ICD-10-CM

## 2018-11-22 DIAGNOSIS — G9341 Metabolic encephalopathy: ICD-10-CM

## 2018-11-22 DIAGNOSIS — D696 Thrombocytopenia, unspecified: ICD-10-CM

## 2018-11-22 DIAGNOSIS — D684 Acquired coagulation factor deficiency: ICD-10-CM

## 2018-11-22 DIAGNOSIS — E861 Hypovolemia: ICD-10-CM

## 2018-11-22 DIAGNOSIS — K210 Gastro-esophageal reflux disease with esophagitis: ICD-10-CM

## 2018-11-22 DIAGNOSIS — K7030 Alcoholic cirrhosis of liver without ascites: ICD-10-CM

## 2018-11-22 DIAGNOSIS — I8500 Esophageal varices without bleeding: ICD-10-CM

## 2018-11-22 DIAGNOSIS — E876 Hypokalemia: ICD-10-CM

## 2018-11-22 DIAGNOSIS — N179 Acute kidney failure, unspecified: ICD-10-CM

## 2018-11-22 DIAGNOSIS — Z8614 Personal history of Methicillin resistant Staphylococcus aureus infection: ICD-10-CM

## 2018-11-22 DIAGNOSIS — D62 Acute posthemorrhagic anemia: ICD-10-CM

## 2018-11-22 DIAGNOSIS — K264 Chronic or unspecified duodenal ulcer with hemorrhage: Principal | ICD-10-CM

## 2018-11-22 DIAGNOSIS — K7290 Hepatic failure, unspecified without coma: ICD-10-CM

## 2018-11-22 DIAGNOSIS — I493 Ventricular premature depolarization: ICD-10-CM

## 2018-11-22 LAB — MANUAL DIFF
Lab: 2 % (ref 0–5)
Lab: 6 % (ref 4–12)
Lab: 6 % — ABNORMAL LOW (ref 24–44)
Lab: 86 % — ABNORMAL HIGH (ref 41–77)

## 2018-11-22 LAB — CBC
Lab: 14 10*3/uL — ABNORMAL HIGH (ref 4.5–11.0)
Lab: 17 % — ABNORMAL HIGH (ref 11–15)
Lab: 2.6 M/UL — ABNORMAL LOW (ref 4.4–5.5)
Lab: 23 % — ABNORMAL LOW (ref 40–50)
Lab: 34 g/dL (ref 32.0–36.0)
Lab: 57 10*3/uL — ABNORMAL LOW (ref 150–400)
Lab: 8.1 FL (ref 7–11)
Lab: 8.1 g/dL — ABNORMAL LOW (ref 13.5–16.5)
Lab: 91 FL (ref 80–100)

## 2018-11-22 LAB — COMPREHENSIVE METABOLIC PANEL: Lab: 141 MMOL/L — ABNORMAL HIGH (ref 137–147)

## 2018-11-22 LAB — PHOSPHORUS: Lab: 3 mg/dL — ABNORMAL HIGH (ref 2.0–4.5)

## 2018-11-22 LAB — CBC AND DIFF: Lab: 10 K/UL — ABNORMAL LOW (ref >60)

## 2018-11-22 LAB — MAGNESIUM: Lab: 2 mg/dL — ABNORMAL HIGH (ref 1.6–2.6)

## 2018-11-22 MED ORDER — TRAZODONE 50 MG PO TAB
50 mg | ORAL_TABLET | Freq: Every evening | ORAL | 0 refills | Status: AC | PRN
Start: 2018-11-22 — End: 2019-07-22

## 2018-11-22 MED ORDER — POTASSIUM CHLORIDE 20 MEQ PO TBTQ
60 meq | Freq: Once | ORAL | 0 refills | Status: CP
Start: 2018-11-22 — End: ?
  Administered 2018-11-22: 16:00:00 60 meq via ORAL

## 2018-11-22 MED ORDER — AMOXICILLIN 500 MG PO CAP
1000 mg | ORAL_CAPSULE | Freq: Two times a day (BID) | ORAL | 0 refills | 7.00000 days | Status: AC
Start: 2018-11-22 — End: ?

## 2018-11-22 MED ORDER — FOLIC ACID 1 MG PO TAB
1 mg | ORAL_TABLET | Freq: Every day | ORAL | 0 refills | Status: AC
Start: 2018-11-22 — End: 2019-07-22

## 2018-11-22 MED ORDER — CLARITHROMYCIN 500 MG PO TAB
500 mg | ORAL_TABLET | Freq: Two times a day (BID) | ORAL | 0 refills | Status: AC
Start: 2018-11-22 — End: ?

## 2018-11-22 MED ORDER — PANTOPRAZOLE 40 MG PO TBEC
40 mg | ORAL_TABLET | Freq: Two times a day (BID) | ORAL | 1 refills | 90.00000 days | Status: AC
Start: 2018-11-22 — End: ?

## 2018-11-22 MED ORDER — THIAMINE HCL (VITAMIN B1) 100 MG PO TAB
100 mg | ORAL_TABLET | Freq: Every day | ORAL | 0 refills | 10.00000 days | Status: AC
Start: 2018-11-22 — End: 2019-07-22

## 2018-11-22 MED ORDER — TRAZODONE 50 MG PO TAB
50 mg | Freq: Once | ORAL | 0 refills | Status: CP
Start: 2018-11-22 — End: ?
  Administered 2018-11-22: 08:00:00 50 mg via ORAL

## 2018-11-22 MED ORDER — LACTULOSE 10 GRAM/15 ML PO SOLN
20 g | Freq: Three times a day (TID) | ORAL | 1 refills | 21.00000 days | Status: AC
Start: 2018-11-22 — End: 2020-07-07

## 2018-11-25 ENCOUNTER — Encounter: Admit: 2018-11-25 | Discharge: 2018-11-25 | Payer: MEDICARE

## 2018-11-26 ENCOUNTER — Encounter: Admit: 2018-11-26 | Discharge: 2018-11-26 | Payer: MEDICARE

## 2018-11-28 ENCOUNTER — Encounter: Admit: 2018-11-28 | Discharge: 2018-11-28 | Payer: MEDICARE

## 2018-11-28 ENCOUNTER — Inpatient Hospital Stay
Admit: 2018-11-28 | Discharge: 2018-12-01 | Disposition: A | Discharge status: Home / Self Care | Payer: MEDICARE | Source: Other Acute Inpatient Hospital | Attending: Pulmonary Disease | Admitting: Pulmonary Disease

## 2018-11-28 DIAGNOSIS — M009 Pyogenic arthritis, unspecified: ICD-10-CM

## 2018-11-28 DIAGNOSIS — K7460 Unspecified cirrhosis of liver: ICD-10-CM

## 2018-11-28 DIAGNOSIS — M1990 Unspecified osteoarthritis, unspecified site: ICD-10-CM

## 2018-11-28 DIAGNOSIS — K922 Gastrointestinal hemorrhage, unspecified: ICD-10-CM

## 2018-11-28 DIAGNOSIS — K7290 Hepatic failure, unspecified without coma: Principal | ICD-10-CM

## 2018-11-28 DIAGNOSIS — R7881 Bacteremia: ICD-10-CM

## 2018-11-28 DIAGNOSIS — F1011 Alcohol abuse, in remission: ICD-10-CM

## 2018-11-28 LAB — CBC AND DIFF
Lab: 0 10*3/uL (ref 0–0.20)
Lab: 0.1 10*3/uL (ref 0–0.45)
Lab: 0.5 10*3/uL (ref 0–0.80)
Lab: 0.6 10*3/uL — ABNORMAL LOW (ref 1.0–4.8)
Lab: 1 % (ref 0–2)
Lab: 10 % — ABNORMAL LOW (ref 24–44)
Lab: 126 10*3/uL — ABNORMAL LOW (ref 150–400)
Lab: 2 % (ref 0–5)
Lab: 2.6 M/UL — ABNORMAL LOW (ref 4.4–5.5)
Lab: 21 % — ABNORMAL HIGH (ref 11–15)
Lab: 23 % — ABNORMAL LOW (ref 40–50)
Lab: 29 pg (ref 26–34)
Lab: 32 g/dL (ref 32.0–36.0)
Lab: 5.1 10*3/uL (ref 1.8–7.0)
Lab: 6.4 K/UL (ref 4.5–11.0)
Lab: 7 % (ref 4–12)
Lab: 7.7 FL (ref 7–11)
Lab: 7.8 g/dL — ABNORMAL LOW (ref 13.5–16.5)
Lab: 80 % — ABNORMAL HIGH (ref 41–77)
Lab: 91 FL (ref 80–100)

## 2018-11-28 LAB — COMPREHENSIVE METABOLIC PANEL
Lab: 147 MMOL/L (ref 137–147)
Lab: 161 U/L — ABNORMAL HIGH (ref 25–110)
Lab: 38 U/L (ref 7–40)
Lab: 4.5 mg/dL — ABNORMAL HIGH (ref 0.3–1.2)

## 2018-11-28 LAB — URINALYSIS DIPSTICK
Lab: 7 mL/min (ref >60)
Lab: NEGATIVE mL/min

## 2018-11-28 LAB — BLOOD GASES, ARTERIAL
Lab: 21 mmHg — ABNORMAL LOW (ref 35–45)
Lab: 7.4 MMOL/L — ABNORMAL HIGH (ref 7.35–7.45)
Lab: 88 mmHg — ABNORMAL HIGH (ref 80–100)

## 2018-11-28 LAB — URINALYSIS, MICROSCOPIC

## 2018-11-28 LAB — CBC: Lab: 6 10*3/uL (ref 4.5–11.0)

## 2018-11-28 LAB — PTT (APTT): Lab: 35 s (ref 24.0–36.5)

## 2018-11-28 LAB — LACTIC ACID (BG - RAPID LACTATE)
Lab: 1.5 MMOL/L (ref 0.5–2.0)
Lab: 1.4 MMOL/L (ref 0.5–2.0)

## 2018-11-28 LAB — TSH WITH FREE T4 REFLEX: Lab: 0 uU/mL — ABNORMAL LOW (ref 0.35–5.00)

## 2018-11-28 LAB — AMMONIA: Lab: 85 umol/L — ABNORMAL HIGH (ref 9–35)

## 2018-11-28 LAB — FREE T4-FREE THYROXINE: Lab: 1 ng/dL (ref 0.6–1.6)

## 2018-11-28 LAB — PHOSPHORUS: Lab: 3 mg/dL (ref 2.0–4.5)

## 2018-11-28 LAB — TROPONIN-I
Lab: 0 ng/mL — ABNORMAL LOW (ref 0.0–0.05)
Lab: 0 ng/mL (ref 0.0–0.05)

## 2018-11-28 LAB — BNP (B-TYPE NATRIURETIC PEPTI): Lab: 89 pg/mL — ABNORMAL LOW (ref 0–100)

## 2018-11-28 LAB — PROTIME INR (PT): Lab: 1.3 — ABNORMAL HIGH (ref 0.8–1.2)

## 2018-11-28 LAB — MAGNESIUM: Lab: 1.9 mg/dL (ref 1.6–2.6)

## 2018-11-28 MED ORDER — FOLIC ACID 1 MG PO TAB
1 mg | Freq: Every day | ORAL | 0 refills | Status: DC
Start: 2018-11-28 — End: 2018-12-01
  Administered 2018-12-01: 16:00:00 1 mg via ORAL

## 2018-11-28 MED ORDER — FOLIC ACID IN NS SYR
1 mg | Freq: Every day | INTRAVENOUS | 0 refills | Status: CP
Start: 2018-11-28 — End: ?
  Administered 2018-11-28 – 2018-11-30 (×6): 1 mg via INTRAVENOUS

## 2018-11-28 MED ORDER — LORAZEPAM 1 MG PO TAB
2 mg | ORAL | 0 refills | Status: CN
Start: 2018-11-28 — End: ?

## 2018-11-28 MED ORDER — PANTOPRAZOLE 40 MG IV SOLR
40 mg | Freq: Two times a day (BID) | INTRAVENOUS | 0 refills | Status: DC
Start: 2018-11-28 — End: 2018-11-28

## 2018-11-28 MED ORDER — LORAZEPAM 1 MG PO TAB
1-2 mg | ORAL | 0 refills | Status: CN | PRN
Start: 2018-11-28 — End: ?

## 2018-11-28 MED ORDER — THIAMINE MONONITRATE (VIT B1) 100 MG PO TAB
100 mg | Freq: Every day | ORAL | 0 refills | Status: DC
Start: 2018-11-28 — End: 2018-11-28

## 2018-11-28 MED ORDER — PANTOPRAZOLE IV INFUSION
8 mg/h | INTRAVENOUS | 0 refills | Status: DC
Start: 2018-11-28 — End: 2018-11-28
  Administered 2018-11-28 (×2): 8 mg/h via INTRAVENOUS

## 2018-11-28 MED ORDER — LORAZEPAM 1 MG PO TAB
1 mg | ORAL | 0 refills | Status: CN
Start: 2018-11-28 — End: ?

## 2018-11-28 MED ORDER — THIAMINE HCL (VITAMIN B1) 100 MG/ML IJ SOLN
100 mg | Freq: Every day | INTRAVENOUS | 0 refills | Status: CP
Start: 2018-11-28 — End: ?
  Administered 2018-11-28 – 2018-11-30 (×3): 100 mg via INTRAVENOUS

## 2018-11-28 MED ORDER — OCTREOTIDE IV DRIP
50 ug/h | INTRAVENOUS | 0 refills | Status: CN
Start: 2018-11-28 — End: ?

## 2018-11-28 MED ORDER — OCTREOTIDE (SANDOSTATIN) BOLUS FOR CONTINUOUS INFUSION
50 ug | Freq: Once | INTRAVENOUS | 0 refills | Status: CP
Start: 2018-11-28 — End: ?

## 2018-11-28 MED ORDER — DOXYCYCLINE HYCLATE 100 MG PO TAB
100 mg | Freq: Two times a day (BID) | ORAL | 0 refills | Status: DC
Start: 2018-11-28 — End: 2018-12-01
  Administered 2018-11-29 – 2018-12-01 (×6): 100 mg via ORAL

## 2018-11-28 MED ORDER — PANTOPRAZOLE 40 MG IV SOLR
80 mg | INTRAVENOUS | 0 refills | Status: CN
Start: 2018-11-28 — End: ?

## 2018-11-28 MED ORDER — FOLIC ACID 1 MG PO TAB
1 mg | Freq: Every day | ORAL | 0 refills | Status: DC
Start: 2018-11-28 — End: 2018-11-28

## 2018-11-28 MED ORDER — POTASSIUM CHLORIDE 20 MEQ PO TBTQ
60 meq | Freq: Once | ORAL | 0 refills | Status: CP
Start: 2018-11-28 — End: ?
  Administered 2018-11-29: 04:00:00 60 meq via ORAL

## 2018-11-28 MED ORDER — PEG-ELECTROLYTE SOLN 420 GRAM PO SOLR
4 L | Freq: Once | ORAL | 0 refills | Status: CP
Start: 2018-11-28 — End: ?
  Administered 2018-11-29: 4 L via ORAL

## 2018-11-28 MED ORDER — PANTOPRAZOLE IV INFUSION
8 mg/h | INTRAVENOUS | 0 refills | Status: CN
Start: 2018-11-28 — End: ?

## 2018-11-28 MED ORDER — LACTULOSE 10 GRAM/15 ML PO SOLN
30 mL | Freq: Three times a day (TID) | ORAL | 0 refills | Status: DC
Start: 2018-11-28 — End: 2018-12-01
  Administered 2018-11-28 – 2018-11-29 (×2): 20 g via ORAL

## 2018-11-28 MED ORDER — OCTREOTIDE IV DRIP
50 ug/h | INTRAVENOUS | 0 refills | Status: DC
Start: 2018-11-28 — End: 2018-11-28
  Administered 2018-11-28 (×2): 50 ug/h via INTRAVENOUS

## 2018-11-28 MED ORDER — BISACODYL 5 MG PO TBEC
10 mg | Freq: Once | ORAL | 0 refills | Status: AC | PRN
Start: 2018-11-28 — End: ?

## 2018-11-28 MED ORDER — AMOXICILLIN 500 MG PO CAP
1000 mg | Freq: Two times a day (BID) | ORAL | 0 refills | Status: DC
Start: 2018-11-28 — End: 2018-12-01
  Administered 2018-11-29 – 2018-12-01 (×6): 1000 mg via ORAL

## 2018-11-28 MED ORDER — PANTOPRAZOLE 40 MG IV SOLR
40 mg | Freq: Two times a day (BID) | INTRAVENOUS | 0 refills | Status: DC
Start: 2018-11-28 — End: 2018-12-01
  Administered 2018-11-29 – 2018-11-30 (×4): 40 mg via INTRAVENOUS

## 2018-11-28 MED ORDER — LORAZEPAM 1 MG PO TAB
1 mg | Freq: Two times a day (BID) | ORAL | 0 refills | Status: CN
Start: 2018-11-28 — End: ?

## 2018-11-28 MED ORDER — THIAMINE MONONITRATE (VIT B1) 100 MG PO TAB
100 mg | Freq: Every day | ORAL | 0 refills | Status: DC
Start: 2018-11-28 — End: 2018-12-01
  Administered 2018-12-01: 16:00:00 100 mg via ORAL

## 2018-11-28 MED ORDER — PEG-ELECTROLYTE SOLN 420 GRAM PO SOLR
2 L | ORAL | 0 refills | Status: DC | PRN
Start: 2018-11-28 — End: 2018-12-01

## 2018-11-28 MED ORDER — CLARITHROMYCIN 500 MG PO TAB
500 mg | Freq: Two times a day (BID) | ORAL | 0 refills | Status: DC
Start: 2018-11-28 — End: 2018-12-01
  Administered 2018-11-29 – 2018-12-01 (×6): 500 mg via ORAL

## 2018-11-28 MED ORDER — OCTREOTIDE (SANDOSTATIN) BOLUS FOR CONTINUOUS INFUSION
50 ug | Freq: Once | INTRAVENOUS | 0 refills | Status: CN
Start: 2018-11-28 — End: ?

## 2018-11-29 ENCOUNTER — Encounter: Admit: 2018-11-29 | Discharge: 2018-11-29 | Payer: MEDICARE

## 2018-11-29 ENCOUNTER — Inpatient Hospital Stay: Admit: 2018-11-29 | Discharge: 2018-11-29 | Payer: MEDICARE

## 2018-11-29 DIAGNOSIS — F1011 Alcohol abuse, in remission: ICD-10-CM

## 2018-11-29 DIAGNOSIS — M009 Pyogenic arthritis, unspecified: ICD-10-CM

## 2018-11-29 DIAGNOSIS — R7881 Bacteremia: ICD-10-CM

## 2018-11-29 DIAGNOSIS — K7290 Hepatic failure, unspecified without coma: Principal | ICD-10-CM

## 2018-11-29 DIAGNOSIS — K922 Gastrointestinal hemorrhage, unspecified: ICD-10-CM

## 2018-11-29 DIAGNOSIS — M1990 Unspecified osteoarthritis, unspecified site: ICD-10-CM

## 2018-11-29 LAB — MAGNESIUM
Lab: 1.9 mg/dL (ref >60)
Lab: 1.9 mg/dL — ABNORMAL HIGH (ref >60)
Lab: 2 mg/dL (ref 1.6–2.6)

## 2018-11-29 LAB — CBC
Lab: 143 K/UL — ABNORMAL LOW (ref >60)
Lab: 2.6 M/UL — ABNORMAL LOW (ref >60)
Lab: 21 % — ABNORMAL HIGH (ref >60)
Lab: 24 % — ABNORMAL LOW (ref 40–50)
Lab: 29 pg — ABNORMAL HIGH (ref 26–34)
Lab: 32 g/dL — ABNORMAL LOW (ref 32.0–36.0)
Lab: 6.4 K/UL (ref >60)
Lab: 7.6 FL (ref 7–11)
Lab: 92 FL (ref 80–100)
Lab: 5.9 K/UL — ABNORMAL LOW (ref >60)
Lab: 2.6 M/UL — ABNORMAL LOW (ref 4.4–5.5)
Lab: 24 % — ABNORMAL LOW (ref 40–50)
Lab: 29 pg — ABNORMAL HIGH (ref 26–34)
Lab: 6.3 10*3/uL (ref 4.5–11.0)
Lab: 7.8 g/dL — ABNORMAL LOW (ref 13.5–16.5)

## 2018-11-29 LAB — BASIC METABOLIC PANEL
Lab: 0.7 mg/dL (ref 0.4–1.24)
Lab: 10 mg/dL (ref 7–25)
Lab: 124 mg/dL — ABNORMAL HIGH (ref 70–100)
Lab: 146 MMOL/L (ref 137–147)
Lab: 16 MMOL/L — ABNORMAL LOW (ref 21–30)
Lab: 3.4 MMOL/L — ABNORMAL LOW (ref 3.5–5.1)
Lab: 8.3 mg/dL — ABNORMAL LOW (ref 8.5–10.6)
Lab: >60 mL/min (ref >60)
Lab: >60 mL/min (ref >60)
Lab: 11 (ref 3–12)
Lab: 0.6 mg/dL (ref 0.4–1.24)
Lab: 100 mg/dL (ref 70–100)
Lab: 120 MMOL/L — ABNORMAL HIGH (ref 98–110)
Lab: 145 MMOL/L (ref 137–147)
Lab: 19 MMOL/L — ABNORMAL LOW (ref 21–30)
Lab: 3.8 MMOL/L (ref 3.5–5.1)
Lab: 8 mg/dL (ref 7–25)
Lab: 8 mg/dL — ABNORMAL LOW (ref 8.5–10.6)
Lab: >60 mL/min (ref >60)
Lab: >60 mL/min (ref >60)
Lab: 6 (ref 3–12)

## 2018-11-29 LAB — COMPREHENSIVE METABOLIC PANEL: Lab: 147 MMOL/L — ABNORMAL HIGH (ref >60)

## 2018-11-29 LAB — PROTIME INR (PT): Lab: 1.3 MMOL/L — ABNORMAL HIGH (ref 0.8–1.2)

## 2018-11-29 LAB — PHOSPHORUS: Lab: 2.8 mg/dL — ABNORMAL HIGH (ref 2.0–4.5)

## 2018-11-29 MED ORDER — MAGNESIUM SULFATE IN D5W 1 GRAM/100 ML IV PGBK
1 g | Freq: Once | INTRAVENOUS | 0 refills | Status: CP
Start: 2018-11-29 — End: ?
  Administered 2018-11-29: 12:00:00 1 g via INTRAVENOUS

## 2018-11-29 MED ORDER — LIDOCAINE (PF) 200 MG/10 ML (2 %) IJ SYRG
0 refills | Status: DC
Start: 2018-11-29 — End: 2018-11-29
  Administered 2018-11-29: 22:00:00 50 mg via INTRAVENOUS

## 2018-11-29 MED ORDER — PROPOFOL 10 MG/ML IV EMUL 20 ML (INFUSION)(AM)(OR)
INTRAVENOUS | 0 refills | Status: DC
Start: 2018-11-29 — End: 2018-11-29
  Administered 2018-11-29: 22:00:00 140 ug/kg/min via INTRAVENOUS

## 2018-11-29 MED ORDER — ZOLPIDEM 5 MG PO TAB
5 mg | Freq: Once | ORAL | 0 refills | Status: CP
Start: 2018-11-29 — End: ?
  Administered 2018-11-30: 06:00:00 5 mg via ORAL

## 2018-11-29 MED ORDER — PROPOFOL INJ 10 MG/ML IV VIAL
0 refills | Status: DC
Start: 2018-11-29 — End: 2018-11-29
  Administered 2018-11-29: 22:00:00 20 mg via INTRAVENOUS
  Administered 2018-11-29: 22:00:00 30 mg via INTRAVENOUS
  Administered 2018-11-29: 22:00:00 50 mg via INTRAVENOUS
  Administered 2018-11-29: 22:00:00 30 mg via INTRAVENOUS

## 2018-11-29 MED ORDER — LACTATED RINGERS IV SOLP
1000 mL | INTRAVENOUS | 0 refills | Status: DC
Start: 2018-11-29 — End: 2018-11-29
  Administered 2018-11-29: 22:00:00 1000.000 mL via INTRAVENOUS

## 2018-11-29 MED ORDER — POTASSIUM CHLORIDE 20 MEQ PO TBTQ
60 meq | Freq: Once | ORAL | 0 refills | Status: CP
Start: 2018-11-29 — End: ?
  Administered 2018-11-29: 12:00:00 60 meq via ORAL

## 2018-11-29 MED ORDER — ONDANSETRON HCL (PF) 4 MG/2 ML IJ SOLN
4 mg | Freq: Once | INTRAVENOUS | 0 refills | Status: CN | PRN
Start: 2018-11-29 — End: ?

## 2018-11-29 MED ORDER — FENTANYL CITRATE (PF) 50 MCG/ML IJ SOLN
25 ug | INTRAVENOUS | 0 refills | Status: CN | PRN
Start: 2018-11-29 — End: ?

## 2018-11-29 MED ORDER — SODIUM CHLORIDE 0.9 % IV SOLP
INTRAVENOUS | 0 refills | Status: DC
Start: 2018-11-29 — End: 2018-11-29

## 2018-11-29 MED ORDER — CARVEDILOL 3.125 MG PO TAB
3.125 mg | Freq: Two times a day (BID) | ORAL | 0 refills | Status: DC
Start: 2018-11-29 — End: 2018-12-01
  Administered 2018-11-30 – 2018-12-01 (×4): 3.125 mg via ORAL

## 2018-11-29 MED ORDER — POTASSIUM CHLORIDE 20 MEQ PO TBTQ
40 meq | Freq: Once | ORAL | 0 refills | Status: CP
Start: 2018-11-29 — End: ?
  Administered 2018-11-30: 40 meq via ORAL

## 2018-11-29 MED ADMIN — SODIUM CHLORIDE 0.9 % IV SOLP [27838]: 250 mL | INTRAVENOUS | @ 12:00:00 | Stop: 2018-11-29 | NDC 00338004902

## 2018-11-30 LAB — CBC
Lab: 2.7 M/UL — ABNORMAL LOW (ref 4.4–5.5)
Lab: 2.9 M/UL — ABNORMAL LOW (ref 4.4–5.5)
Lab: 22 % — ABNORMAL HIGH (ref >60)
Lab: 25 % — ABNORMAL LOW (ref 40–50)
Lab: 27 % — ABNORMAL LOW (ref 40–50)
Lab: 30 pg (ref 26–34)
Lab: 32 g/dL (ref 32.0–36.0)
Lab: 32 g/dL — ABNORMAL LOW (ref >60)
Lab: 5.9 K/UL — ABNORMAL LOW (ref 4.5–11.0)
Lab: 6.1 10*3/uL (ref 4.5–11.0)
Lab: 8.2 g/dL — ABNORMAL LOW (ref 13.5–16.5)
Lab: 8.8 g/dL — ABNORMAL LOW (ref 13.5–16.5)
Lab: 9 K/UL — ABNORMAL HIGH (ref 4.5–11.0)
Lab: 91 FL (ref 80–100)

## 2018-11-30 LAB — PROTIME INR (PT): Lab: 1.3 mg/dL — ABNORMAL HIGH (ref 0.8–1.2)

## 2018-11-30 LAB — COMPREHENSIVE METABOLIC PANEL: Lab: 143 MMOL/L — ABNORMAL LOW (ref >60)

## 2018-11-30 LAB — MAGNESIUM: Lab: 1.8 mg/dL — ABNORMAL LOW (ref >60)

## 2018-11-30 LAB — PHOSPHORUS: Lab: 3.2 mg/dL — ABNORMAL LOW (ref >60)

## 2018-11-30 MED ORDER — POTASSIUM CHLORIDE 20 MEQ PO TBTQ
40 meq | Freq: Once | ORAL | 0 refills | Status: CP
Start: 2018-11-30 — End: ?
  Administered 2018-11-30: 16:00:00 40 meq via ORAL

## 2018-11-30 MED ORDER — HYDROCORTISONE 2.5 % TP CREA
Freq: Two times a day (BID) | TOPICAL | 0 refills | Status: DC
Start: 2018-11-30 — End: 2018-12-01
  Administered 2018-11-30: 16:00:00 via TOPICAL

## 2018-11-30 MED ORDER — PANTOPRAZOLE 40 MG PO TBEC
40 mg | Freq: Two times a day (BID) | ORAL | 0 refills | Status: DC
Start: 2018-11-30 — End: 2018-12-01
  Administered 2018-12-01 (×2): 40 mg via ORAL

## 2018-11-30 MED ORDER — PSYLLIUM HUSK (WITH SUGAR) 3.4 GRAM PO PWPK
2 | Freq: Two times a day (BID) | ORAL | 0 refills | Status: DC
Start: 2018-11-30 — End: 2018-12-01
  Administered 2018-11-30 – 2018-12-01 (×3): 6.8 g via ORAL

## 2018-11-30 MED ORDER — MELATONIN 3 MG PO TAB
3 mg | Freq: Every evening | ORAL | 0 refills | Status: DC | PRN
Start: 2018-11-30 — End: 2018-12-01

## 2018-11-30 MED ORDER — MAGNESIUM SULFATE IN D5W 1 GRAM/100 ML IV PGBK
1 g | INTRAVENOUS | 0 refills | Status: CP
Start: 2018-11-30 — End: ?
  Administered 2018-11-30 (×2): 1 g via INTRAVENOUS

## 2018-12-01 ENCOUNTER — Inpatient Hospital Stay: Admit: 2018-11-28 | Discharge: 2018-11-28 | Payer: MEDICARE

## 2018-12-01 ENCOUNTER — Inpatient Hospital Stay: Admit: 2018-11-29 | Discharge: 2018-11-29 | Payer: MEDICARE

## 2018-12-01 DIAGNOSIS — Z79899 Other long term (current) drug therapy: ICD-10-CM

## 2018-12-01 DIAGNOSIS — Z87891 Personal history of nicotine dependence: ICD-10-CM

## 2018-12-01 DIAGNOSIS — I82611 Acute embolism and thrombosis of superficial veins of right upper extremity: ICD-10-CM

## 2018-12-01 DIAGNOSIS — I340 Nonrheumatic mitral (valve) insufficiency: ICD-10-CM

## 2018-12-01 DIAGNOSIS — B9681 Helicobacter pylori [H. pylori] as the cause of diseases classified elsewhere: ICD-10-CM

## 2018-12-01 DIAGNOSIS — E872 Acidosis: ICD-10-CM

## 2018-12-01 DIAGNOSIS — M1990 Unspecified osteoarthritis, unspecified site: ICD-10-CM

## 2018-12-01 DIAGNOSIS — D62 Acute posthemorrhagic anemia: ICD-10-CM

## 2018-12-01 DIAGNOSIS — K7290 Hepatic failure, unspecified without coma: ICD-10-CM

## 2018-12-01 DIAGNOSIS — K648 Other hemorrhoids: ICD-10-CM

## 2018-12-01 DIAGNOSIS — D696 Thrombocytopenia, unspecified: ICD-10-CM

## 2018-12-01 DIAGNOSIS — Z8614 Personal history of Methicillin resistant Staphylococcus aureus infection: ICD-10-CM

## 2018-12-01 DIAGNOSIS — K621 Rectal polyp: ICD-10-CM

## 2018-12-01 DIAGNOSIS — K645 Perianal venous thrombosis: Principal | ICD-10-CM

## 2018-12-01 DIAGNOSIS — F1010 Alcohol abuse, uncomplicated: ICD-10-CM

## 2018-12-01 LAB — CBC: Lab: 6.6 K/UL — ABNORMAL HIGH (ref 4.5–11.0)

## 2018-12-01 LAB — PROTIME INR (PT): Lab: 1.4 MMOL/L — ABNORMAL HIGH (ref 0.8–1.2)

## 2018-12-01 LAB — MAGNESIUM: Lab: 1.8 mg/dL — ABNORMAL HIGH (ref >60)

## 2018-12-01 LAB — PHOSPHORUS: Lab: 2.6 mg/dL — ABNORMAL LOW (ref >60)

## 2018-12-01 LAB — COMPREHENSIVE METABOLIC PANEL: Lab: 141 MMOL/L — ABNORMAL LOW (ref 137–147)

## 2018-12-01 MED ORDER — PSYLLIUM HUSK (WITH SUGAR) 3.4 GRAM PO PWPK
2 | Freq: Two times a day (BID) | ORAL | 0 refills | Status: AC
Start: 2018-12-01 — End: 2019-07-22

## 2018-12-01 MED ORDER — MAGNESIUM SULFATE IN D5W 1 GRAM/100 ML IV PGBK
1 g | Freq: Once | INTRAVENOUS | 0 refills | Status: CP
Start: 2018-12-01 — End: ?
  Administered 2018-12-01: 12:00:00 1 g via INTRAVENOUS

## 2018-12-01 MED ORDER — POTASSIUM CHLORIDE 20 MEQ PO TBTQ
60 meq | Freq: Once | ORAL | 0 refills | Status: CP
Start: 2018-12-01 — End: ?
  Administered 2018-12-01: 12:00:00 60 meq via ORAL

## 2018-12-01 MED ORDER — HYDROCORTISONE 2.5 % TP CREA
TOPICAL | 0 refills | 30.00000 days | Status: AC | PRN
Start: 2018-12-01 — End: 2019-07-22

## 2018-12-01 MED ORDER — TRAZODONE 50 MG PO TAB
50 mg | Freq: Every evening | ORAL | 0 refills | Status: DC | PRN
Start: 2018-12-01 — End: 2018-12-01
  Administered 2018-12-01: 07:00:00 50 mg via ORAL

## 2018-12-02 ENCOUNTER — Encounter: Admit: 2018-12-02 | Discharge: 2018-12-02 | Payer: MEDICARE

## 2018-12-02 DIAGNOSIS — K7290 Hepatic failure, unspecified without coma: Principal | ICD-10-CM

## 2018-12-02 DIAGNOSIS — M009 Pyogenic arthritis, unspecified: ICD-10-CM

## 2018-12-02 DIAGNOSIS — R7881 Bacteremia: ICD-10-CM

## 2018-12-02 DIAGNOSIS — F1011 Alcohol abuse, in remission: ICD-10-CM

## 2018-12-02 DIAGNOSIS — M1990 Unspecified osteoarthritis, unspecified site: ICD-10-CM

## 2019-01-20 ENCOUNTER — Encounter: Admit: 2019-01-20 | Discharge: 2019-01-20 | Payer: MEDICARE

## 2019-01-25 ENCOUNTER — Encounter: Admit: 2019-01-25 | Discharge: 2019-01-25 | Payer: MEDICARE

## 2019-01-25 DIAGNOSIS — I340 Nonrheumatic mitral (valve) insufficiency: Principal | ICD-10-CM

## 2019-01-27 MED ORDER — CARVEDILOL 3.125 MG PO TAB
ORAL_TABLET | Freq: Two times a day (BID) | 0 refills
Start: 2019-01-27 — End: ?

## 2019-02-21 ENCOUNTER — Encounter: Admit: 2019-02-21 | Discharge: 2019-02-21 | Payer: MEDICARE

## 2019-07-10 ENCOUNTER — Encounter: Admit: 2019-07-10 | Discharge: 2019-07-10

## 2019-07-10 NOTE — Progress Notes
Rand Etchison, 02-28-1956 has an appointment with Dr. Domingo Cocking on 07/16/2019.    Please send recent lab results for continuity of care.    Thank you,   Capitola Ladson    Phone: [phone]  Fax: [phone]

## 2019-07-11 ENCOUNTER — Encounter: Admit: 2019-07-11 | Discharge: 2019-07-11

## 2019-07-14 ENCOUNTER — Encounter: Admit: 2019-07-14 | Discharge: 2019-07-14

## 2019-07-14 NOTE — Telephone Encounter
LVM returning wife's call.

## 2019-07-22 ENCOUNTER — Ambulatory Visit: Admit: 2019-07-22 | Discharge: 2019-07-22

## 2019-07-22 ENCOUNTER — Encounter: Admit: 2019-07-22 | Discharge: 2019-07-22

## 2019-07-22 DIAGNOSIS — K7030 Alcoholic cirrhosis of liver without ascites: Secondary | ICD-10-CM

## 2019-07-22 DIAGNOSIS — F1011 Alcohol abuse, in remission: Secondary | ICD-10-CM

## 2019-07-22 DIAGNOSIS — K7290 Hepatic failure, unspecified without coma: Secondary | ICD-10-CM

## 2019-07-22 DIAGNOSIS — Z13820 Encounter for screening for osteoporosis: Secondary | ICD-10-CM

## 2019-07-22 DIAGNOSIS — M8588 Other specified disorders of bone density and structure, other site: Secondary | ICD-10-CM

## 2019-07-22 DIAGNOSIS — R7881 Bacteremia: Secondary | ICD-10-CM

## 2019-07-22 DIAGNOSIS — M1990 Unspecified osteoarthritis, unspecified site: Secondary | ICD-10-CM

## 2019-07-22 DIAGNOSIS — M009 Pyogenic arthritis, unspecified: Secondary | ICD-10-CM

## 2019-07-22 LAB — CBC AND DIFF
Lab: 0 10*3/uL (ref 0–0.20)
Lab: 3.8 M/UL — ABNORMAL LOW (ref 4.4–5.5)
Lab: 5.1 10*3/uL (ref 4.5–11.0)

## 2019-07-22 LAB — COMPREHENSIVE METABOLIC PANEL
Lab: 0.6 mg/dL — ABNORMAL HIGH (ref 0.4–1.24)
Lab: 143 MMOL/L — ABNORMAL LOW (ref 137–147)
Lab: 3 g/dL — ABNORMAL LOW (ref 3.5–5.0)
Lab: 3 mg/dL — ABNORMAL HIGH (ref 0.3–1.2)
Lab: 3.9 MMOL/L — ABNORMAL LOW (ref 3.5–5.1)
Lab: 8 10*3/uL (ref 3–12)
Lab: 97 mg/dL (ref 70–100)
Lab: >60 mL/min (ref >60)
Lab: >60 mL/min (ref >60)

## 2019-07-22 LAB — IRON + BINDING CAPACITY + %SAT+ FERRITIN
Lab: 18 % — ABNORMAL LOW (ref 28–42)
Lab: 313 ug/dL — ABNORMAL HIGH (ref 270–380)
Lab: 54 ng/mL (ref 30–300)
Lab: 56 ug/dL (ref 50–185)

## 2019-07-22 LAB — ALPHA FETO PROTEIN (AFP): Lab: 1.8 ng/mL (ref 0.0–15.0)

## 2019-07-22 LAB — HEPATITIS A TOTAL AB (IGG+IGM)

## 2019-07-22 LAB — PROTIME INR (PT): Lab: 1.4 MMOL/L — ABNORMAL HIGH (ref 0.8–1.2)

## 2019-07-22 LAB — HEPATITIS B SURFACE AB: Lab: NEGATIVE FL — ABNORMAL LOW (ref 7–11)

## 2019-07-22 MED ORDER — RIFAXIMIN 550 MG PO TAB
550 mg | ORAL_TABLET | Freq: Two times a day (BID) | ORAL | 11 refills | 30.00000 days | Status: DC
Start: 2019-07-22 — End: 2020-07-07

## 2019-07-23 ENCOUNTER — Encounter: Admit: 2019-07-23 | Discharge: 2019-07-23

## 2019-07-23 LAB — 25-OH VITAMIN D (D2 + D3): Lab: 23 ng/mL — ABNORMAL LOW (ref 30–80)

## 2019-07-23 NOTE — Progress Notes
No prior authorization was required for Xifaxan for Arthur Kidd at this time.  The copay is $885.68.    The specialty pharmacy has reached out to the provider's nurse Arthur Kidd) via e-mail to further assist with the cost of the medication.         [place] Patient Advocate  [phone]

## 2019-07-25 LAB — ALPHA 1 ANTITRYPSIN PHENOTYPE: Lab: 112 mg/dL — ABNORMAL LOW (ref 8.5–10.6)

## 2019-07-28 ENCOUNTER — Encounter: Admit: 2019-07-28 | Discharge: 2019-07-28 | Payer: MEDICARE

## 2019-07-29 NOTE — Telephone Encounter
Pt's wife Peggy LVM returning call. Can mail paperwork to their address in their chart.

## 2019-07-29 NOTE — Telephone Encounter
LVM returning patient's call. Need patient or spouse's signature on [employer] form.

## 2019-07-29 NOTE — Progress Notes
Date of Service: 07/22/2019    Arthur Kidd is a 63 y.o. male.  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  I initially met Arthur Kidd in 2015, but this is the first hospital follow-up visit he has attended.  He is accompanied by his wife.  They have been married for 10 years.  She is a good historian.  He reports he was initially diagnosed with cirrhosis in 2004 when he presented with a variceal bleed.  He underwent a TIPS.  This was at the [facility] [place].  Apparently he was told that his disease was quite severe, but I suspect he was never listed for transplant due to ongoing alcohol use.  He reports that he initially started drinking alcohol at age 46 and began to drink quite heavily.  He is unable to state how much, but it may have been at least a bottle of wine per night.  He has had multiple DUIs and multiple rehab visits some of which have been court ordered.  He did have a period of at least 5 years of sobriety, but subsequently started drinking again in 2010. He denies any alcohol since August or September 2019, but per the [place] hospitalization admission note in December 2019 when he was found there were many bottles of alcohol found near him at home and family members were suspicious he had been dinking. I was asked to see him initially in 2015 for management of his hepatic encephalopathy.  At that time he declined further follow-up with the hepatology service.  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  During that admission, he also had hepatic encephalopathy. He was subsequently readmitted to [facility] December 7 through November 22, 2018 after presenting with an upper GI bleed.  He underwent an EGD and was found to have 2 deep nonbleeding duodenal ulcers with necrotic surfaces.  He was also found to have LA grade B esophagitis and small esophageal varices.  He continued to bleed overnight and required coil embolization of the GDA on December 8.  He was found to have H. pylori and was treated with triple therapy.  He was readmitted December 19 through December 22 with hematochezia.  He underwent a colonoscopy and was found to have large internal and external hemorrhoids and a 5 mm rectal polyp.  He had band ligation of his hemorrhoids.  The rectal polyp showed tubular adenoma.  The prep was fair.  He was treated with fiber and hydrocortisone cream.  He again declined follow up with hepatology.     He reports he was again hospitalized in May 2020 at [facility] for a GI bleed.  I do not have the EGD reports or the discharge summary.  He was most recently admitted to [facility] on July 30 with hepatic encephalopathy.  He was treated with lactulose.  Prior to that, he was admitted on July 4 to [facility] hospital with altered mental status and anemia.  He reports he had 2 units of packed red blood cells.  He received no endoscopy during that admission.    Today, he is the most  alert and coherent I have ever seen him.  He denies any jaundice.  He denies any confusion since his most recent discharge at the end of July.  He denies any issues with fluid retention.  He has not required a paracentesis.  He denies any abdominal pain.  He has had issues with significant encephalopathy despite use of lactulose with adequate bowel movements.  His wife reports that he has at least 3 bowel movements per day.  He does feel that he is continuing to lose muscle mass.  He is able to ambulate using walker, crutches, or cane. He denies any history of IV drug use, intranasal drug use, high risk sexual behavior, blood transfusion prior to 1992, and tattoos.  He has no family history of liver disease.  Regarding etiology of chronic liver disease, he had an iron panel in August 2015 which was not consistent with hemochromatosis.  He had a normal ANA, AMA, and ASMA in 2015.  He was tested for hepatitis B and C in 2015 and negative.    It sounds as if his major goal with this visit is to gain assistance with Rifaximin.  His wife inquires about liver transplant, but he states he is not interested in this.     Past Medical History  Osteomyelits/MRSA septicemia/septic arthritis of the right hip  Alcoholic cirrhosis  History of TIPS  MRSA bacteremia with mitral valve endocarditis, TEE in 2017 with flail posterior mitral leaflet with perforation. Leaflet involvement extends to the posterior mitral valve annulus which is thickened and suggestive for infection or abscess. Moderate to severe mitral valve regurgitation.  Duodenal ulcer, status post coil embolization of the gastroduodenal artery.  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  He has no children.  His alcohol history is as per HPI.  Alcohol history is as per HPI.  He has been chewing 1 can of school per week since age 66.  He denies smoking tobacco. He denies any history of illicit drug use.  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    Family History  Father with cancer.  Mother with heart disease.  Sibling with lung cancer.     Review of Systems  Right hip pain.  Knee pain.  Right shoulder pain.  Insomnia.  Visual disturbance.  A complete 10 point review of systems is negative other than as listed in HPI.    Objective:         ??? carvedilol (COREG) 3.125 mg tablet TAKE ONE TABLET BY MOUTH TWICE DAILY WITH FOOD   ??? doxycycline (MONODOX) 100 mg capsule TAKE 1 CAPSULE TWICE DAILY   ??? lactobacillus rhamnosus GG (CULTURELLE) 15 billion cell cpSP capsule Take 1 capsule by mouth as directed twice daily with meals.   ??? lactulose 10 gram/15 mL oral solution Take 30 mL by mouth three times daily. Indications: impaired brain function due to liver disease   ??? Milk Thistle 175 mg tab Take  by mouth daily.   ??? oxyCODONE (ROXICODONE) 5 mg tablet Take 5 mg by mouth every 12 hours as needed for Pain   ??? pantoprazole DR (PROTONIX) 40 mg tablet Take one tablet by mouth twice  daily.   ??? potassium chloride SR (K-DUR) 20 mEq tablet Take 20 mEq by mouth twice daily. Take with a meal and a full glass of water.   ??? rifAXIMin (XIFAXAN) 550 mg tablet Take one tablet by mouth every 12 hours.     Vitals:    07/22/19 1352   BP: 138/77   BP Source: Arm, Left Upper   Patient Position: Sitting   Pulse: 78   Resp: 16   Temp: 36.5 ???C (97.7 ???F)   TempSrc: Oral   SpO2: 92%   Weight: 69.7 kg (153 lb 9.6 oz)   Height: 177.8 cm (70)   PainSc: Six     Body mass index is 22.04 kg/m???.     Physical Exam  Constitutional: Patient appears thin, in no apparent distress.  Responds appropriately to questions.  Neuro:  No tremor noted.  Patient is AOx4.  No asterixis.  HEENT:  Head is normocephalic and atraumatic.  +scleral icterus noted.  Conjunctiva pink and moist.  EOM normal & PERRLA.  Oral mucosa pink and moist.  Edentulous.  Neck and Lymph: Normal ROM. Neck supple. No thyromegaly.  No cervical lymphadenopathy.  No JVD  Cardiac:  S1 and S2, regular rate and rhythm.     Respiratory:  lung sounds clear to auscultation bilaterally.  No wheezes or crackles.  GI:  Abdomen soft, round, non-distended, non-tender. BS present. No clinically evident ascites. No hernia.  No appreciable hepatosplenomegaly.     Skin:  Skin is warm, dry, and intact. No rash noted. Nails show no clubbing. No jaundice.  +palmar erythema   Peripheral Vascular: No edema.  Musculoskeletal:  ROM intact.    Psychiatric: Normal mood and affect. Behavior is normal. Judgment and thought content normal.       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    1. Decompensated alcoholic cirrhosis  2. MRSA septicemia complicated by MV endocarditis with perforation.  3. History of septic arthritis  4. Tobacco use  Arthur Kidd???has decompensated alcoholic cirrhosis. His MELD is 14.  Unfortunately, he appears to have potential barriers to transplant including medical noncompliance, lack of alcohol counseling, active tobacco use, possible significant ongoing heart disease, and possible ongoing hip infection.??? Today, his wife asked whether he would be a candidate for liver transplant and he stated that he wasn't certan he would be interested in this in the future.  Unfortunately, I do not suspect he will be able to meet criteria for transplant.   I advised him that if he wished to be considered for liver transplant in the future, at a bare minimum, he would need to demonstrate compliance with medical recommendations, have close follow-up consistently (in the past he has declined to follow up with [place] - again this is the first time I have seen him in clinic and I originally met him in 2015 as he has declined follow up), complete 6 months of weekly alcohol counseling, and have an extensive evaluation by our infectious disease colleagues as well as cardiology colleagues to assess candidacy should all the other barriers to be overcome. I have told him that he should not drink any further alcohol as any ongoing alcohol use may kill him. He reports???sobriety since August or September 2019 (but the records suggest possibly December 2019). ???I have advised him that to potentially be considered for  OLT at [place],???he???will need to avoid alcohol (including NA beverages and alcohol containing cough syrups, etc) and would???need???to complete six months of weekly alcohol counseling (one-on-one alcohol counseling times x 26 weekly sessions???(one hour at a time).  I will have Nicanor Bake our transplant social worker reach out to him. ???I have advised him that he will need to avoid tobacco???if he wishes to be considered for OLT. ???He will be subject to random screens. ???He should also avoid tobacco as well given the risk of cardiovascular disease, malignancy, and pulmonary disease.??? We will check A1AT phenotype and iron panel to complete the workup for chronic liver disease.???  We will see if he???is immune???to hepatitis A and hepatitis B. If not, he should be vaccinated.??????He should avoid NSAIDs. ???He can take up to 2000 millligrams of Tylenol per day. ???He should avoid raw shellfish.  He should take thiamine and folate.    He is on chronic doxycycline therapy for suppression of his MRSA.  He should follow-up with infectious disease.    He should follow-up with cardiology regarding his mitral valve endocarditis.  He is on carvedilol.  He has an appointment Dr. Neale Burly in October 2020.    5. ???At risk for hepatocellular carcinoma.   6. History of TIPS. He had a TIPS placed in 2004 for a variceal bleed.  He most recently had imaging on November 16, 2018 which showed a cirrhotic appearing liver, no bile duct dilation, cholelithiasis, and patent TIPS.  ???We discussed the increased risk of hepatocellular carcinoma in patients with cirrhosis. ???He should have an ultrasound, liver MRI, or quadruple phase CT scan every 6 months with alpha-fetoprotein for [facility] screening. ???He is overdue for [facility] screening.  We will arrange for an [place] now (to assess TIPS patency and for [facility]) and triple phase CT in 6 months.??? ???We will???check an???AFP now.???    7. ???At risk for esophageal varices.  8. Duodenal ulcer  9. 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 At risk for colon cancer.  11. Esophagitis  In December 2019, he underwent an EGD and was found to have 2 deep nonbleeding duodenal ulcers with necrotic surfaces.  He was also found to have LA grade B esophagitis and small esophageal varices.  He required coil embolization of the GDA on December 8.  He was found to have H. pylori and was treated with triple therapy.  He underwent a colonoscopy in December 2019 and was found to have large internal and external hemorrhoids and a 5 mm rectal polyp.  He had band ligation of his hemorrhoids.  The rectal polyp showed a tubular adenoma.  The prep was fair.  He was treated with fiber and hydrocortisone cream.  He should have a repeat colonoscopy in December 2020 given suboptimal bowel preparation.  He reports he had a GI bleed in May 2020.  We will obtain those records (endoscopy and discharge summary).  It is unclear to me whether a repeat EGD was done during that admission.  If not, he is due for an EGD now.    He should continue his PPI for now given his recurrent duodenal ulcers.  He should have a H. pylori stool test to confirm eradication in the future after he is off PPI (or will need to hold for 1-2 weeks).     12. Hepatic encephalopathy.  He has a history of significant encephalopathy despite adequate upper bowel purge per his wife. He should continue lactulose and titrate to 3-4 BMs/day.  We will try  to get him approved for rifaximin.  He is currently on oxycodone and I have advised him to discontinue this as this may be contributing to his encephalopathy. ???He should avoid narcotics, benzodiazepines, Benadryl, and muscle relaxants???as these may worsen encephalopathy.??????He should not drive.    ??? 13. ???At risk for osteoporosis. ???We discussed the increased risk of osteoporosis in patients with liver disease. ???We will have him get a DEXA. We will check a???vitamin D level.  ???  14.  Malnutrition. ???  Given his chronic liver disease, he is at risk for malnutrition. ???He???would benefit from a high-protein diet. ???I would encourage him to consume 1.2 to 1.5 g/kg of protein daily. ???He can consider use of Ensure, Boost, or Carnation instant breakfast with protein powder.??????We will check a vitamin D level with his next labs.  ???  15.  Debility.  He appears mildly debilitated..  I recommended he consider home PT.  ???  16.  Health maintenance.  I recommend an annual influenza vaccine.  We discussed COVID-19 precautions.     He will return to clinic in 6 months.

## 2019-07-29 NOTE — Telephone Encounter
Paperwork mailed to patients home address

## 2019-08-06 ENCOUNTER — Encounter: Admit: 2019-08-06 | Discharge: 2019-08-06

## 2019-08-06 DIAGNOSIS — A048 Other specified bacterial intestinal infections: Secondary | ICD-10-CM

## 2019-08-06 DIAGNOSIS — K269 Duodenal ulcer, unspecified as acute or chronic, without hemorrhage or perforation: Secondary | ICD-10-CM

## 2019-08-06 NOTE — Telephone Encounter
LVM for patient's wife.    Please review labs with patient. MELD 14. Vitamin D low. Recommend vitamin D 50,000 IU once a week x12 weeks, then repeat level. He should also take vitamin D 2000 IU daily. Hemoglobin stable. Await discharge summary and EGD report from outside hospital. He should hold his PPI for 2 weeks and then complete an H. pylori stool test. After tests completed, he should resume his PPI. Recommend hepatitis A and B vaccination. AFP normal. Await outside ultrasound.

## 2019-08-06 NOTE — Telephone Encounter
Wife returned your call regarding.  Please cal her.

## 2019-08-06 NOTE — Telephone Encounter
Wife returned your call.  Please call again.

## 2019-08-08 ENCOUNTER — Encounter: Admit: 2019-08-08 | Discharge: 2019-08-08

## 2019-08-08 MED ORDER — CHOLECALCIFEROL (VITAMIN D3) 50 MCG (2,000 UNIT) PO TAB
2000 [IU] | ORAL_TABLET | Freq: Every day | ORAL | 0 refills | 84.00000 days | Status: AC
Start: 2019-08-08 — End: ?

## 2019-08-08 MED ORDER — ERGOCALCIFEROL (VITAMIN D2) 1,250 MCG (50,000 UNIT) PO CAP
1 | ORAL_CAPSULE | ORAL | 0 refills | 56.00000 days | Status: AC
Start: 2019-08-08 — End: ?

## 2019-08-08 NOTE — Telephone Encounter
Plan: contact pt regarding alcohol history and counseling resources    Intervention:  SW requested to contact pt regarding alcohol history and and counseling resources.  SW reviewed EMR.  SW called and spoke with pt.  SW explained role and reason for call.  Sw asked when pt's last drink was.  Pt started talking about drinking heavily in the past.  He states he 2004 he was drinking heavily and was throwing up blood. He states he had EV and followed with a liver specialist. He states he went to [place] and after a while was told he didn't meet criteria for liver transplant because he was doing better. He states that was 15-16 yrs ago. Pt states he stopped taking lactulose for several years and was doing fine until recently when he had two episodes of HE. Pt states he started taking lactulose again religously.  Pt states he takes Milk Thistle and had missed for a month. He states that is when he had the HE. He contributes no longer having issues to taking lactulose and milk thistle.      SW explained if pt needs a transplant in the future he will need to abstain from alcohol in any form, NA beer, wine, etc.  SW also explained pt will have to complete counseling. Pt states I have been through so much counseling, you wouldn't believe.  He states he has gone to [employer] and programs at [facility].  SW agreed to mail local resources for counseling to pt. Pt is hopeful that he will not need a transplant but is aware he needs to do counseling.  SW discussed counseling options and TH/virtual counseling.  SW mailed pt a list of local resources, counseling checklist and SW contact information to address on file.      Pt discuss negative side effects of Lactulose. Pt states his and his wife's birthday are next month and their anniversary is next month. He states they are looking [place] to [place].    SW answered all questions and requested pt call with future questions/concerns. Dr. Adela Lank updated.  Nicanor Bake, LMSW

## 2019-08-12 ENCOUNTER — Encounter: Admit: 2019-08-12 | Discharge: 2019-08-12

## 2019-08-14 ENCOUNTER — Encounter: Admit: 2019-08-14 | Discharge: 2019-08-14

## 2019-09-19 ENCOUNTER — Encounter: Admit: 2019-09-19 | Discharge: 2019-09-19 | Payer: MEDICARE

## 2019-09-19 NOTE — Telephone Encounter
LVM.

## 2019-09-19 NOTE — Telephone Encounter
-----   Message from Sherilyn Banker, MD sent at 09/19/2019  5:03 AM CDT -----  Do you know if this has been done and was going to be done locally? It was ordered 8/11. Thanks.  ----- Message -----  From: Sherilyn Banker, MD  Sent: 09/19/2019  To: Sherilyn Banker, MD      ----- Message -----  From: Sherilyn Banker, MD  Sent: 09/05/2019  To: Sherilyn Banker, MD    [place] due

## 2019-09-22 ENCOUNTER — Encounter: Admit: 2019-09-22 | Discharge: 2019-09-22 | Payer: MEDICARE

## 2019-09-22 NOTE — Telephone Encounter
Last ov: 10/27/16 - pt has not been seen for almost 3 years

## 2019-09-23 MED ORDER — DOXYCYCLINE MONOHYDRATE 100 MG PO CAP
ORAL_CAPSULE | Freq: Two times a day (BID) | 3 refills | 10.00000 days | Status: AC
Start: 2019-09-23 — End: ?

## 2019-10-20 ENCOUNTER — Encounter: Admit: 2019-10-20 | Discharge: 2019-10-20 | Payer: MEDICARE

## 2019-10-20 NOTE — Telephone Encounter
-----   Message from Sherilyn Banker, MD sent at 10/17/2019  8:18 AM CST -----  Sorry, has he had this?  I know you said he would have the week of 10/12. Thanks!  ----- Message -----  From: Sherilyn Banker, MD  Sent: 10/17/2019  To: Sherilyn Banker, MD      ----- Message -----  From: Sherilyn Banker, MD  Sent: 09/19/2019  To: Sherilyn Banker, MD      ----- Message -----  From: Sherilyn Banker, MD  Sent: 09/05/2019  To: Sherilyn Banker, MD    [place] due

## 2019-10-20 NOTE — Telephone Encounter
LVM for patient's wife to see if [place] has been done yet.

## 2020-01-08 ENCOUNTER — Encounter: Admit: 2020-01-08 | Discharge: 2020-01-08 | Payer: MEDICARE

## 2020-01-08 NOTE — Telephone Encounter
Called pt. No answer. Left message asking for a return call to change his f/u appt with Dr. Adela Lank to [facility] or cxl/rsch. Advised not to show up to clinic. Left name and number.

## 2020-01-23 ENCOUNTER — Encounter: Admit: 2020-01-23 | Discharge: 2020-01-23 | Payer: MEDICARE

## 2020-01-23 NOTE — Telephone Encounter
Call from [facility] regarding appointment with Dr. Adela Lank.  Please return her call.

## 2020-03-21 IMAGING — CT BRAIN WO(Adult)
3 of 4 series · 14 of 47 positions shown, 16 images · non-contrast
Comparison: none

[Series 2: brain ax 5.00 hr40 s3 · axial · 0.34mm/px · z∈[-560,-423]mm · 8 of 35 slices shown, 10 images]
[im 3/35  brain]
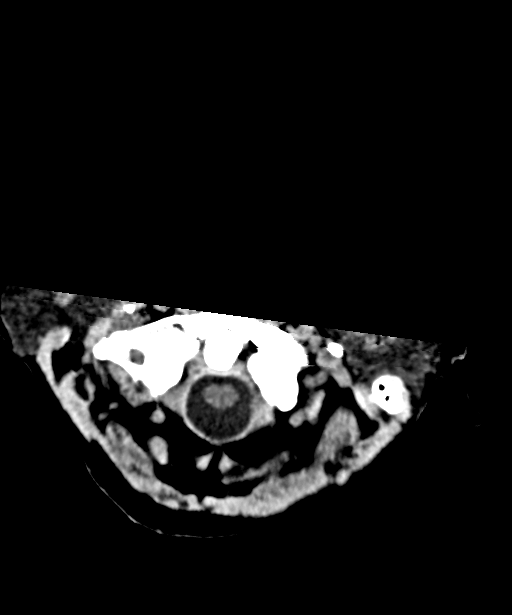
[im 3/35  bone]
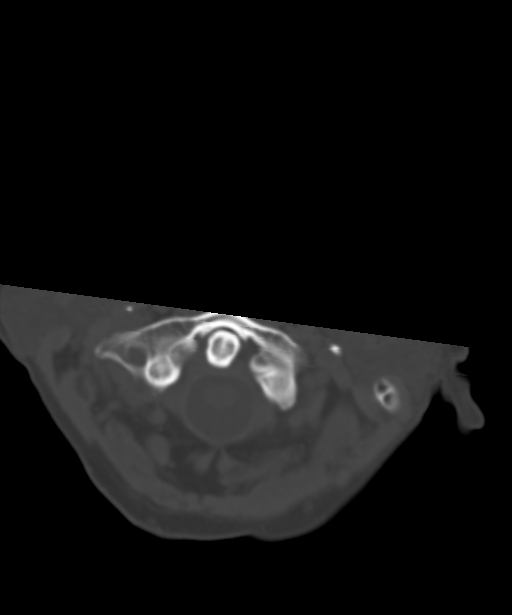
[im 8/35  brain]
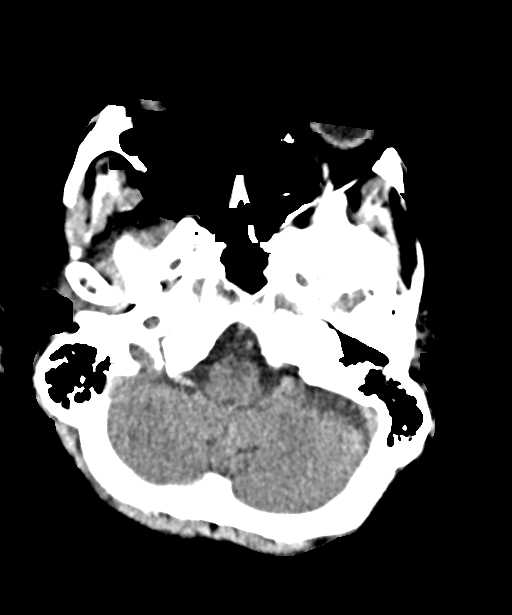
[im 13/35  brain]
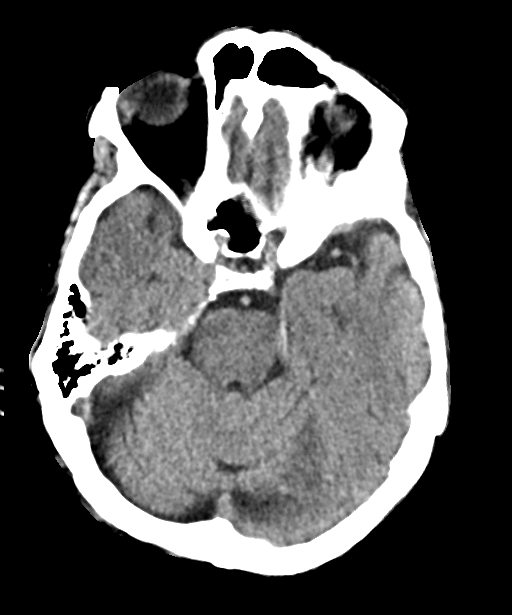
[im 15/35  brain]
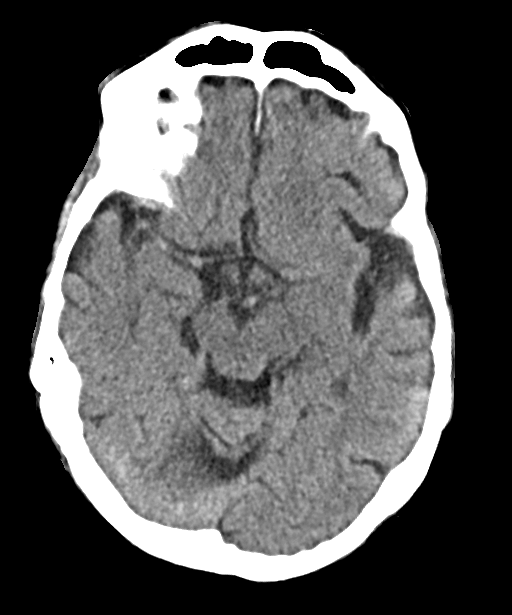
[im 20/35  brain]
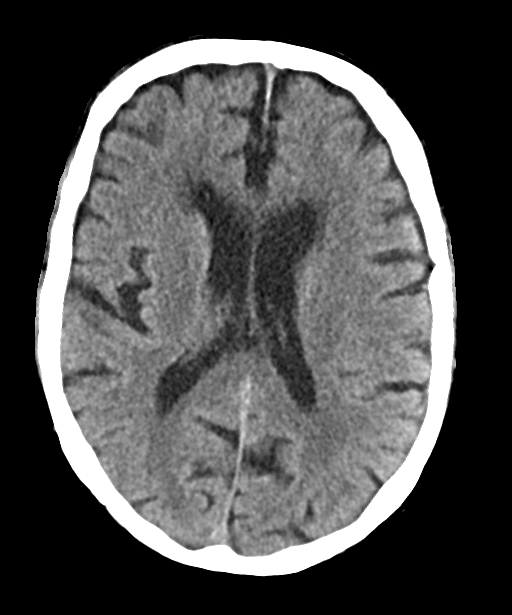
[im 20/35  bone]
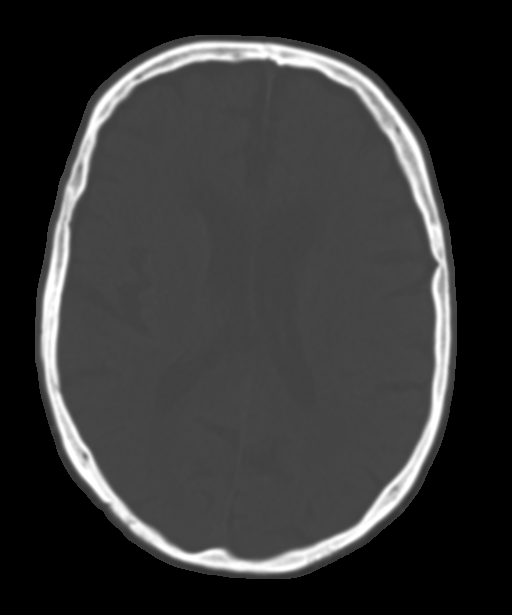
[im 22/35  brain]
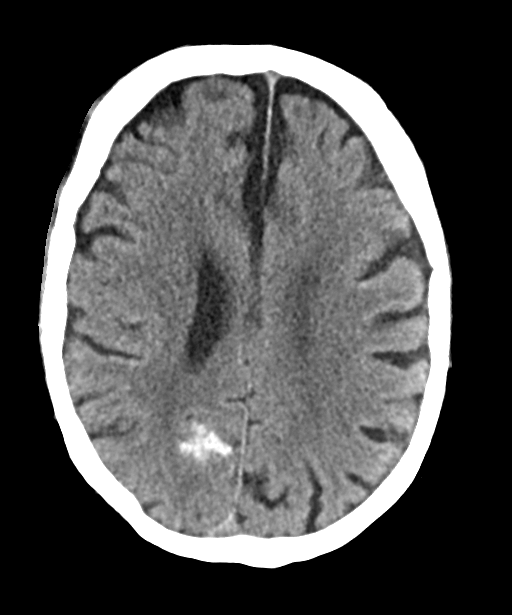
[im 27/35  brain]
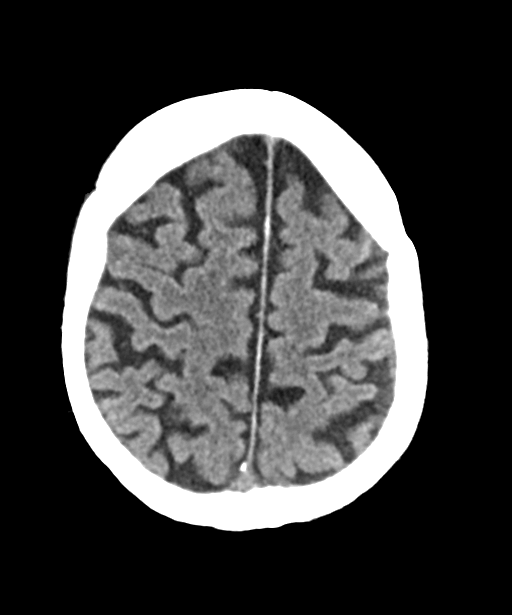
[im 32/35  brain]
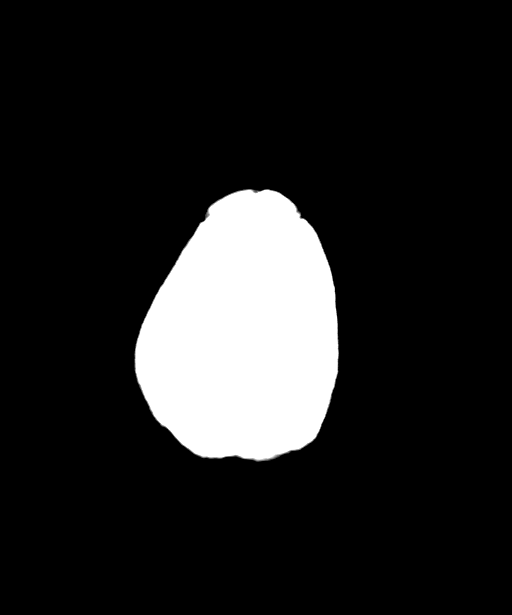

[Series 4: brain cor 5.00 hr40 s3 · coronal · 0.34mm/px · 3 of 42 slices shown]
[im 14/42  brain]
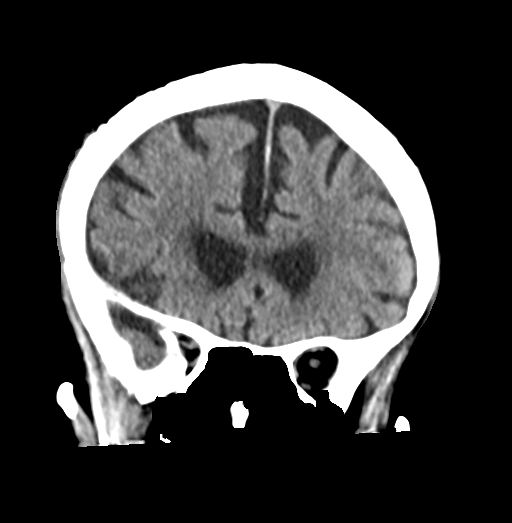
[im 19/42  brain]
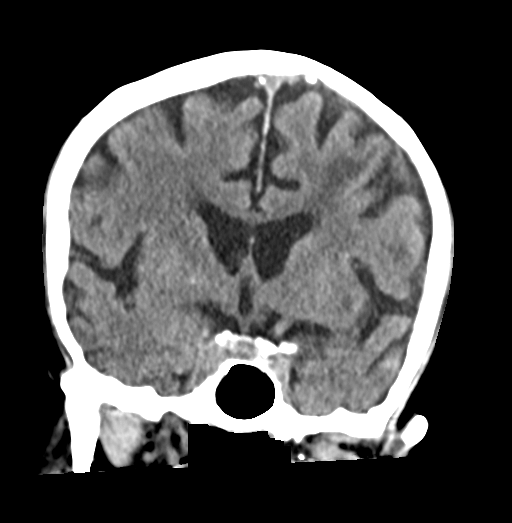
[im 23/42  brain]
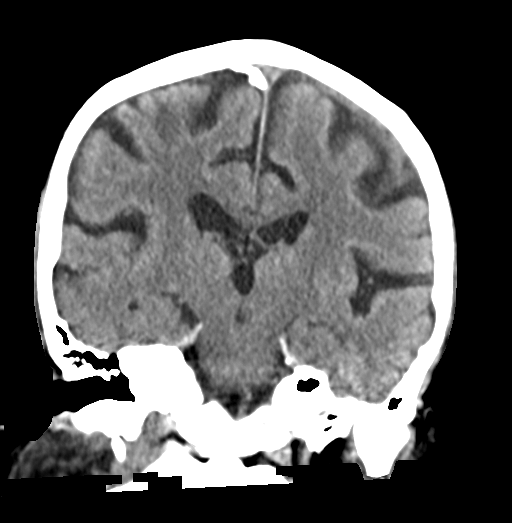

[Series 6: brain sag 5.00 hr40 s3 · sagittal · 0.35mm/px · 3 of 35 slices shown]
[im 12/35  brain]
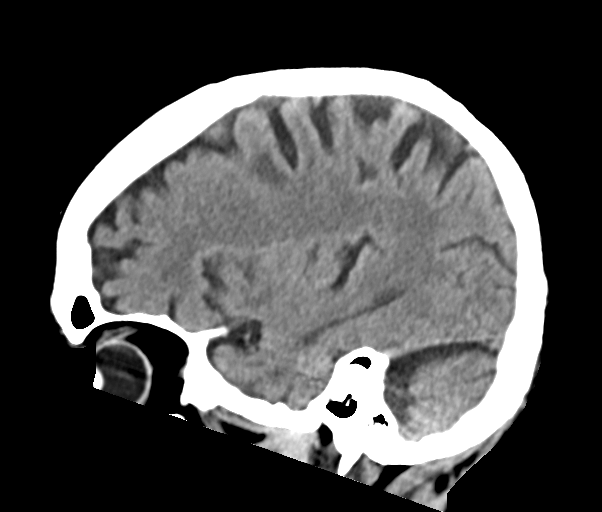
[im 18/35  brain]
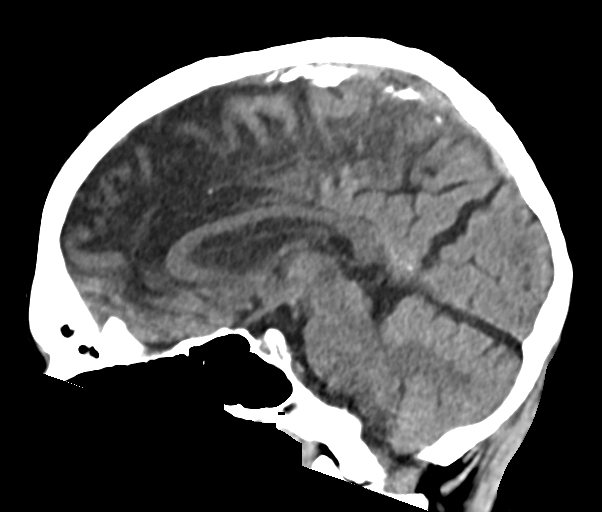
[im 23/35  brain]
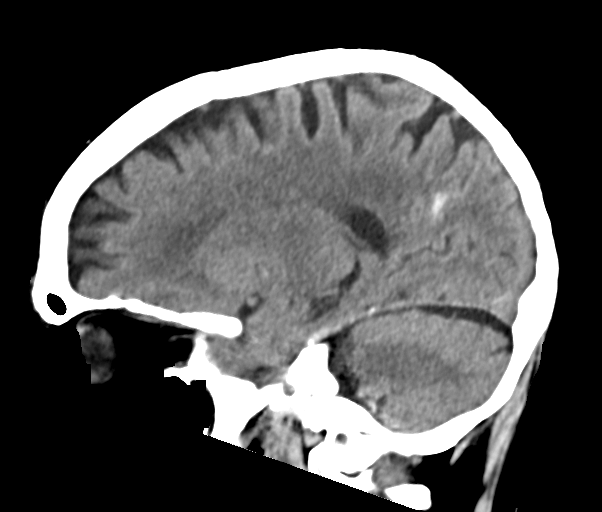

[14 of 47 positions shown; findings below may reference images not displayed]

EXAM

COMPUTED TOMOGRAPHY, HEAD OR BRAIN; WITHOUT CONTRAST MATERIAL CPT 73543

INDICATION

Right frontal contusion and confusion. CC/AK/ME

TECHNIQUE

Multiple contiguous transaxial images were obtained through the brain utilizing a multidetector CT
scanner.

All CT scans at this facility use dose modulation, iterative reconstruction, and/or weight based
dosing when appropriate to reduce radiation dose to as low as reasonably achievable.

COMPARISONS

Previous CT scan dated 10/02/2016.

FINDINGS

There is diffuse cortical atrophy. The ventricles and sulci are accordingly prominent. There is no
mass effect or shift in the midline structures. Lucency throughout the centrum ovale is consistent
with deep white matter ischemic changes and periventricular leukomalacia.

Right posterior parietal subarachnoid intracranial hemorrhage is identified. This is best seen on
series 2, image 22. Corresponding sagittal series 6, image 22 demonstrates the abnormality as well.
The hemorrhage is located in the marginal branch of the marginal sulcus on the right posterior
parietal region. Clinical correlation is advised. The skull base overlying and overlying calvarium
are within normal limits.

IMPRESSION

Cortical atrophy and age related involutional changes. Periventricular leukomalacia. Focus of
subarachnoid hemorrhage in the right posterior parietal lobe located in the marginal branch of the
marginal sulcus in the posterior aspect of the right parietal lobe. Clinical correlation is advised.

Tech Notes:

Right frontal contusion and confusion. CC/AK/ME

## 2020-03-21 IMAGING — CR CHEST
1 series · 1 of 1 positions shown · non-contrast
Comparison: none

[chest port x-wise]
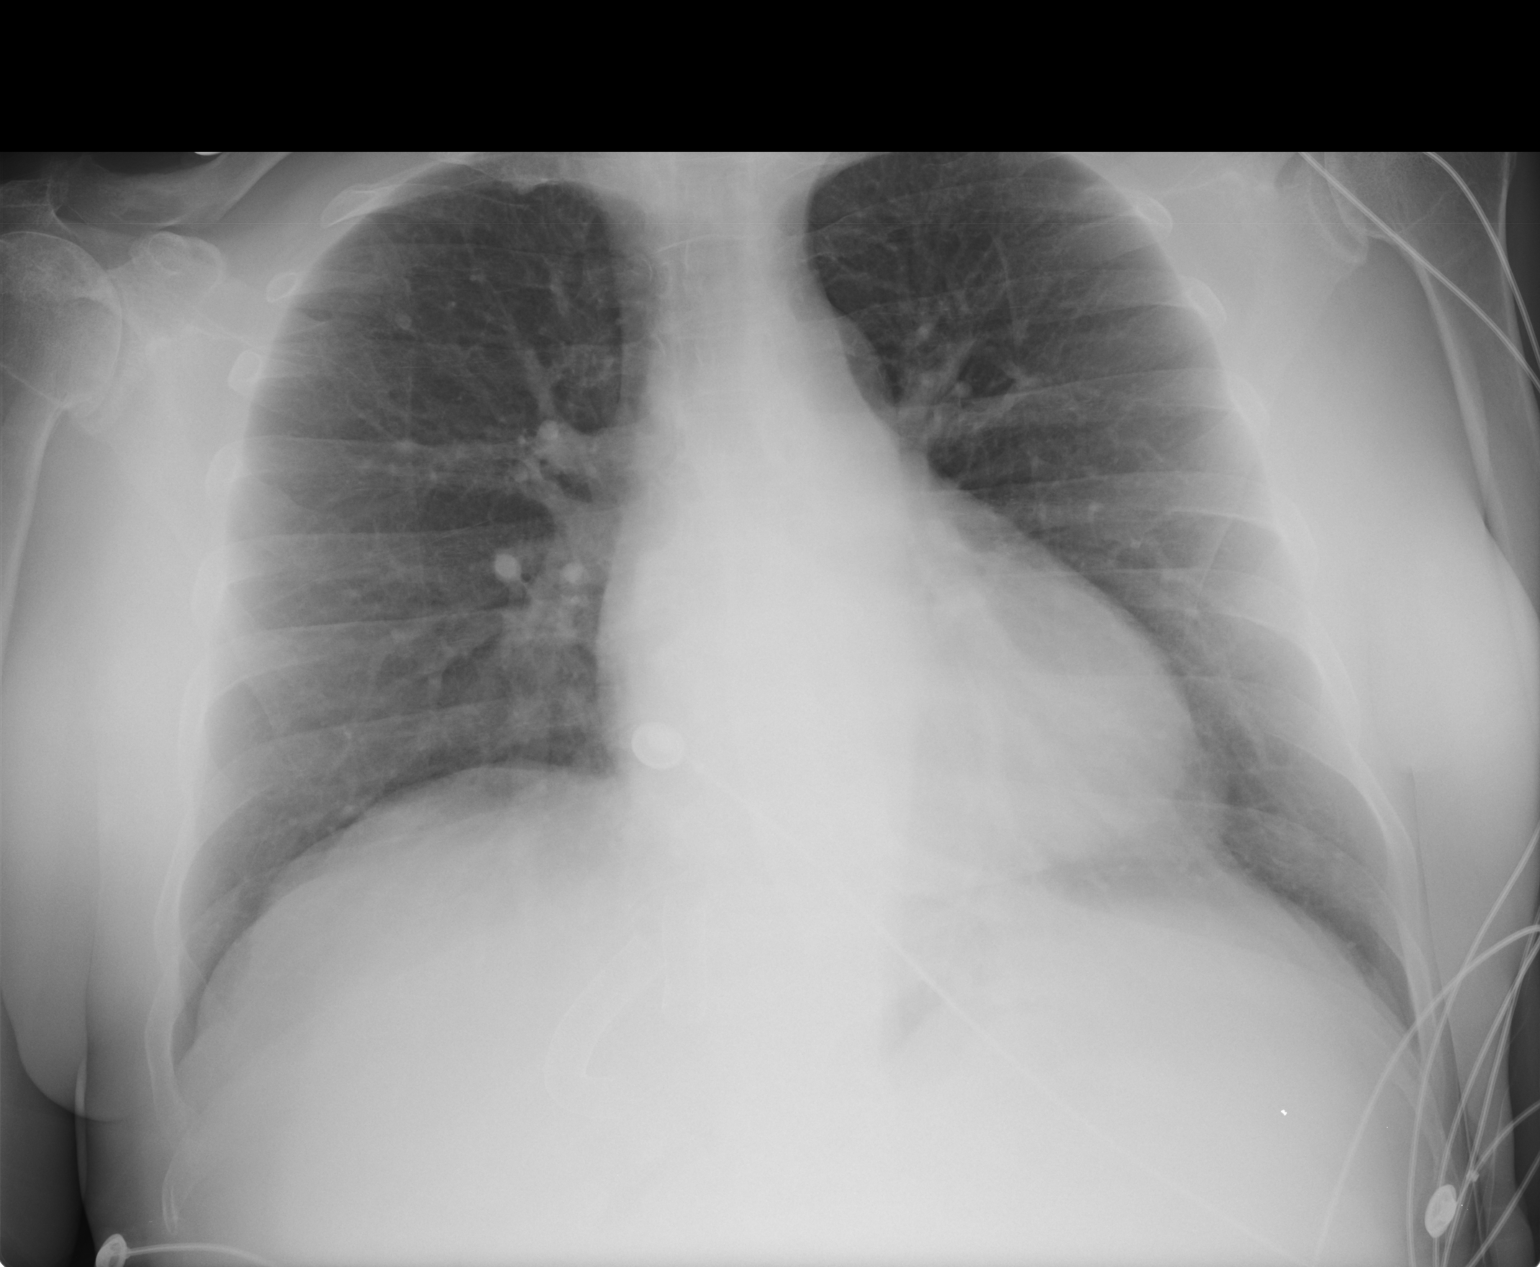

[1 of 1 positions shown; findings below may reference images not displayed]

EXAM

XR chest 1V

INDICATION

Altered loss of consciousness

TECHNIQUE

Single view of the chest

COMPARISONS

10/02/2016

FINDINGS

No radiographically apparent pleural effusion, consolidation, or pneumothorax. There are likely
calcified granulomas within the right hilum.

The cardiomediastinal silhouette is  normal in size.

The osseous structures are unchanged.

IMPRESSION
- No radiographic evidence of an acute cardiopulmonary process.

Tech Notes:

Right frontal contusion and confusion. CC/AK/ME

## 2020-03-22 ENCOUNTER — Encounter: Admit: 2020-03-22 | Discharge: 2020-03-22 | Payer: MEDICARE

## 2020-03-22 NOTE — Telephone Encounter
Msg left for return call to get pt scheduled for clinic follow up appt with Czarina Morley,APRN. Requested call back, and direct number left for call back.

## 2020-03-26 ENCOUNTER — Encounter: Admit: 2020-03-26 | Discharge: 2020-03-26 | Payer: MEDICARE

## 2020-06-30 ENCOUNTER — Encounter: Admit: 2020-06-30 | Discharge: 2020-06-30 | Payer: MEDICARE

## 2020-06-30 NOTE — Telephone Encounter
Called pt. No answer. Left message asking for a return call to schedule a f/u appt with Dr. Adela Lank. Mentioned that he will need an appt otherwise we will not be able to refill his Rifaximin when due. Left name and number.     Arthur Kidd: LTC: Pt f/u-last seen 07/22/2019

## 2020-07-05 ENCOUNTER — Encounter: Admit: 2020-07-05 | Discharge: 2020-07-05 | Payer: MEDICARE

## 2020-07-05 ENCOUNTER — Inpatient Hospital Stay: Admit: 2020-07-05 | Discharge: 2020-07-05 | Payer: MEDICARE

## 2020-07-05 ENCOUNTER — Emergency Department: Admit: 2020-07-05 | Discharge: 2020-07-05 | Payer: MEDICARE

## 2020-07-05 ENCOUNTER — Inpatient Hospital Stay: Admit: 2020-07-05 | Payer: MEDICARE

## 2020-07-05 DIAGNOSIS — M1990 Unspecified osteoarthritis, unspecified site: Secondary | ICD-10-CM

## 2020-07-05 DIAGNOSIS — R7881 Bacteremia: Secondary | ICD-10-CM

## 2020-07-05 DIAGNOSIS — M009 Pyogenic arthritis, unspecified: Secondary | ICD-10-CM

## 2020-07-05 DIAGNOSIS — F1011 Alcohol abuse, in remission: Secondary | ICD-10-CM

## 2020-07-05 DIAGNOSIS — K7290 Hepatic failure, unspecified without coma: Secondary | ICD-10-CM

## 2020-07-05 DIAGNOSIS — G9340 Encephalopathy, unspecified: Secondary | ICD-10-CM

## 2020-07-05 LAB — URINALYSIS DIPSTICK REFLEX TO CULTURE
Lab: NEGATIVE 10*3/uL (ref 3–12)
Lab: NEGATIVE MMOL/L — ABNORMAL LOW (ref 21–30)
Lab: NEGATIVE U/L — ABNORMAL HIGH (ref 25–110)
Lab: NEGATIVE mL/min (ref 0–0.80)
Lab: NEGATIVE mL/min (ref 1.0–4.8)

## 2020-07-05 LAB — POC LACTATE: Lab: 1.7 MMOL/L (ref 0.5–2.0)

## 2020-07-05 LAB — AMMONIA: Lab: 120 umol/L — ABNORMAL HIGH (ref 9–35)

## 2020-07-05 LAB — POC TROPONIN: Lab: 0 ng/mL (ref 0.00–0.05)

## 2020-07-05 LAB — CBC AND DIFF
Lab: 0 10*3/uL (ref 0–0.20)
Lab: 0.3 10*3/uL (ref 0–0.45)
Lab: 5.3 10*3/uL (ref 4.5–11.0)
Lab: 19 (ref <20.7)

## 2020-07-05 LAB — URINALYSIS MICROSCOPIC REFLEX TO CULTURE

## 2020-07-05 LAB — COMPREHENSIVE METABOLIC PANEL
Lab: 100 mg/dL (ref 70–100)
Lab: 121 MMOL/L — ABNORMAL HIGH (ref 98–110)
Lab: 146 MMOL/L — ABNORMAL LOW (ref 137–147)
Lab: 4.1 MMOL/L — ABNORMAL LOW (ref 3.5–5.1)

## 2020-07-05 LAB — POC GLUCOSE: Lab: 98 mg/dL (ref 70–100)

## 2020-07-05 LAB — PROTIME INR (PT): Lab: 1.4 mg/dL — ABNORMAL HIGH (ref 0.8–1.2)

## 2020-07-05 LAB — PTT (APTT): Lab: 46 s — ABNORMAL HIGH (ref 24.0–36.5)

## 2020-07-05 MED ORDER — LACTULOSE(#) 200GM/300ML RECTAL ENEMA
200 g | Freq: Once | RECTAL | 0 refills | Status: DC
Start: 2020-07-05 — End: 2020-07-05

## 2020-07-05 MED ORDER — HALOPERIDOL LACTATE 5 MG/ML IJ SOLN
5 mg | INTRAMUSCULAR | 0 refills | Status: DC | PRN
Start: 2020-07-05 — End: 2020-07-06

## 2020-07-05 MED ORDER — CARVEDILOL 3.125 MG PO TAB
3.125 mg | Freq: Two times a day (BID) | ORAL | 0 refills | Status: DC
Start: 2020-07-05 — End: 2020-07-07
  Administered 2020-07-06 – 2020-07-07 (×3): 3.125 mg via ORAL

## 2020-07-05 MED ORDER — DOXYCYCLINE HYCLATE 100 MG PO TAB
100 mg | Freq: Two times a day (BID) | ORAL | 0 refills | Status: DC
Start: 2020-07-05 — End: 2020-07-07
  Administered 2020-07-06 – 2020-07-07 (×3): 100 mg via ORAL

## 2020-07-05 MED ORDER — HALOPERIDOL 5 MG PO TAB
5 mg | ORAL | 0 refills | Status: DC | PRN
Start: 2020-07-05 — End: 2020-07-06

## 2020-07-05 MED ORDER — LACTULOSE(#) 200GM/300ML RECTAL ENEMA
200 g | RECTAL | 0 refills | Status: DC
Start: 2020-07-05 — End: 2020-07-06
  Administered 2020-07-06: 03:00:00 200 g via RECTAL

## 2020-07-05 MED ORDER — RIFAXIMIN 550 MG PO TAB
550 mg | Freq: Two times a day (BID) | ORAL | 0 refills | Status: DC
Start: 2020-07-05 — End: 2020-07-07
  Administered 2020-07-06 – 2020-07-07 (×3): 550 mg via ORAL

## 2020-07-05 MED ORDER — PANTOPRAZOLE 40 MG PO TBEC
40 mg | Freq: Two times a day (BID) | ORAL | 0 refills | Status: DC
Start: 2020-07-05 — End: 2020-07-07
  Administered 2020-07-06 – 2020-07-07 (×3): 40 mg via ORAL

## 2020-07-05 MED ORDER — LACTULOSE 10 GRAM/15 ML PO LIQUID GROUP
20 g | Freq: Three times a day (TID) | ORAL | 0 refills | Status: DC
Start: 2020-07-05 — End: 2020-07-07
  Administered 2020-07-06 – 2020-07-07 (×5): 20 g via ORAL

## 2020-07-05 NOTE — H&P (View-Only)
Admission History and Physical Examination      Name:  Arthur Kidd                                             MRN:  [id number]   Admission Date:  07/05/2020                     Assessment/Plan:    Active Problems:    * No active hospital problems. 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    Acute encephalopathy, suspect PSE  - Brought to the ED due to altered mental status. Per ED, patient has not taken his Lactulose or Rifaximin for at least 5 days per his wife.   - CT Head and C spine with no acute abnormalities  - Hgb stable, no current evidence of GI bleed  - Patient moaning to verbal and painful stimuli. He is currently protecting his airway.  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 Patient did not seem to have pain with movement of his RLE.  PLAN:  - Continue chronic suppression with Doxyclycine 100mg  BID. He needs to follow up with his infectious disease physician.  - Will obtain blood cultures    Chronic thrombocytopenia and anemia  - Suspect related to cirrhosis  - Plts 89 on admission  - Hgb stable  PLAN:  - Check iron, B12 and folate  - Monitor CBC    Diet: Strict NPO  FULL CODE by default, needs further discussion.  Dispo: Admit to Medicine  __________________________________________________________________________________  Primary Care Physician: Steva Ready  Verified    Chief Complaint:  Altered mental status  History of Present Illness: Arthur Kidd is a 64 y.o. 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    Tried calling patient's wife multiple times to obtain history but unable to reach. History obtained from the ED who notes that the wife noted that the patient has not taken Lactulose for the last 4-5 days and has developed worsening AMS. She noted that he was admitted last month to [facility] for AMS and was given Lactulose with improvement. She also reported a fall a couple days ago.    Medical History:   Diagnosis Date   ? Arthritis    ? End-stage liver disease (HCC)    ? History of alcohol abuse    ? MRSA bacteremia 09/02/12   ? Septic hip (HCC) 09/2012     Surgical History:   Procedure Laterality Date   ? ABSCESS DRAINAGE  10/13    right hip, resection of right femoral head   ? IRRIGATION AND DEBRIDEMENT RIGHT HIP Right 09/13/2016    Performed by Heddings, Revonda Standard, MD at [facility] OR   ? ESOPHAGOGASTRODUODENOSCOPY WITH BIOPSY - FLEXIBLE N/A 11/17/2018    Performed by Eliott Nine, MD at [facility] ENDO   ? Colonoscopy N/A 11/29/2018    Performed by Tretha Sciara Jimmy Footman, MD at [facility] ENDO   ? COLONOSCOPY WITH BIOPSY - FLEXIBLE  11/29/2018    Performed by Tretha Sciara Jimmy Footman, MD at [facility] ENDO   ? HX TIPS       Family History   Problem Relation Age of Onset   ? Heart Attack Mother      Social History     Socioeconomic History   ? Marital status: Married     Spouse name: Not on file   ? Number of children: Not on file   ? Years of education: Not on file   ? Highest education level: Not on file   Occupational History   ? Not on file   Tobacco Use   ? Smoking status: Former Smoker     Types: Pipe   ? Smokeless tobacco: Current User     Types: Chew   ? Tobacco comment: Naoma Diener smokes during winter months   Substance and Sexual Activity   ? Alcohol use: No     Alcohol/week: 0.0 standard drinks     Comment: quit in 2004   ? Drug use: No   ? Sexual activity: Not on file   Other Topics Concern   ? Not on file   Social History Narrative    ** Merged History Encounter **          Social Determinants of Health     Financial Resource Strain:    ? Difficulty of Paying Living Expenses:    Food Insecurity:    ? Worried About Programme researcher, broadcasting/film/video in the Last Year:    ? Barista in the Last Year:    Transportation Needs:    ? Freight forwarder (Medical):    ? Lack of Transportation (Non-Medical):    Physical Activity:    ? Days of Exercise per Week:    ? Minutes of Exercise per Session:    Stress:    ? Feeling of Stress :    Social Connections:    ? Frequency of Communication with Friends and Family:    ?  Frequency of Social Gatherings with Friends and Family: ? Attends Religious Services:    ? Active Member of Clubs or Organizations:    ? Attends Banker Meetings:    ? Marital Status:    Intimate Partner Violence:    ? Fear of Current or Ex-Partner:    ? Emotionally Abused:    ? Physically Abused:    ? Sexually Abused:                     Immunizations (includes history and patient reported):   Immunization History   Administered Date(s) Administered   ? Pneumococcal Vaccine(13-Val Peds/immunocompromised adult) 09/16/2012   ? Tdap Vaccine 09/16/2012           Allergies:  Diphenhydramine and Benadryl [diphenhydramine hcl]    Medications:  (Not in a hospital admission)    Review of Systems:  Review of systems not obtained from patient due to patient factors.      Physical Exam:  Vital Signs: Last Filed In 24 Hours Vital Signs: 24 Hour Range   BP: 150/87 (07/26 1604)  Temp: 36.9 ?C (98.5 ?F) (07/26 1604)  Pulse: 61 (07/26 1604)  Respirations: 15 PER MINUTE (07/26 1604)  SpO2: 98 % (07/26 1604)  SpO2 Pulse: 61 (07/26 1604) BP: (150)/(87)   Temp:  [36.9 ?C (98.5 ?F)]   Pulse:  [61]   Respirations:  [15 PER MINUTE]   SpO2:  [98 %]           General:  Somnolent, unkempt, ill-appearing  Head:  Normocephalic, without obvious abnormality, atraumatic  Eyes:  Conjunctivae/corneas clear.  PERRL.  Lungs:  Clear to auscultation bilaterally  Heart:   Regular rate and rhythm, S1, S2 normal, no murmur, click rub or gallop  Abdomen:  Soft, non-tender.  Bowel sounds normal.    Extremities: No edema, RLE with external rotation and shorter than LLE. Did not appear to be in pain with passive ROM. No redness or swelling  Peripheral pulses:   2+ and symmetric, all extremities  Skin: Skin color, texture, turgor normal.  No rashes or lesions  Neurologic:  Somnolent, moans to painful stimuli      Lab/Radiology/Other Diagnostic Tests:  24-hour labs:    Results for orders placed or performed during the hospital encounter of 07/05/20 (from the past 24 hour(s))   POC LACTATE    Collection Time: 07/05/20  4:17 PM   Result Value Ref Range    LACTIC ACID POC 1.7 0.5 - 2.0 MMOL/L   POC TROPONIN    Collection Time: 07/05/20  4:27 PM   Result Value Ref Range    Troponin-I-POC 0.00 0.00 - 0.05 NG/ML   CBC AND DIFF    Collection Time: 07/05/20  4:28 PM   Result Value Ref Range    White Blood Cells 5.3 4.5 - 11.0 K/UL    RBC 3.61 (L) 4.4 - 5.5 M/UL    Hemoglobin 11.8 (L) 13.5 - 16.5 GM/DL    Hematocrit 95.2 (L) 40 - 50 %    MCV 95.3 80 - 100 FL    MCH 32.7 26 - 34 PG    MCHC 34.3 32.0 - 36.0 G/DL    RDW 84.1 (H) 11 - 15 %    Platelet Count 89 (L) 150 - 400 K/UL    MPV 8.3 7 - 11 FL    Neutrophils 63 41 - 77 %    Lymphocytes 23 (L) 24 - 44 %  Monocytes 7 4 - 12 %    Eosinophils 6 (H) 0 - 5 %    Basophils 1 0 - 2 %    Absolute Neutrophil Count 3.35 1.8 - 7.0 K/UL    Absolute Lymph Count 1.24 1.0 - 4.8 K/UL    Absolute Monocyte Count 0.34 0 - 0.80 K/UL    Absolute Eosinophil Count 0.31 0 - 0.45 K/UL    Absolute Basophil Count 0.04 0 - 0.20 K/UL    MDW (Monocyte Distribution Width) 19.9 <20.7   COMPREHENSIVE METABOLIC PANEL    Collection Time: 07/05/20  4:28 PM   Result Value Ref Range    Sodium 146 137 - 147 MMOL/L    Potassium 4.1 3.5 - 5.1 MMOL/L    Chloride 121 (H) 98 - 110 MMOL/L    Glucose 100 70 - 100 MG/DL    Blood Urea Nitrogen 19 7 - 25 MG/DL    Creatinine 0.96 0.4 - 1.24 MG/DL    Calcium 8.5 8.5 - 04.5 MG/DL    Total Protein 4.8 (L) 6.0 - 8.0 G/DL    Total Bilirubin 3.4 (H) 0.3 - 1.2 MG/DL    Albumin 2.7 (L) 3.5 - 5.0 G/DL    Alk Phosphatase 409 (H) 25 - 110 U/L    AST (SGOT) 26 7 - 40 U/L    CO2 17 (L) 21 - 30 MMOL/L    ALT (SGPT) 11 7 - 56 U/L    Anion Gap 8 3 - 12    eGFR Non African American >60 >60 mL/min    eGFR African American >60 >60 mL/min   PROTIME INR (PT)    Collection Time: 07/05/20  4:28 PM   Result Value Ref Range    INR 1.4 (H) 0.8 - 1.2   PTT (APTT)    Collection Time: 07/05/20  4:28 PM   Result Value Ref Range    APTT 46.9 (H) 24.0 - 36.5 SEC   POC GLUCOSE    Collection Time: 07/05/20  4:28 PM   Result Value Ref Range    Glucose, POC 98 70 - 100 MG/DL   AMMONIA    Collection Time: 07/05/20  5:23 PM   Result Value Ref Range    Ammonia 120 (H) 9 - 35 MCMOL/L     Glucose: 100 (07/05/20 1628)  POC Glucose (Download): 98 (07/05/20 1628)  Pertinent radiology reviewed.    Steffanie Dunn, DO  Pager [phone]
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
## 2020-07-06 ENCOUNTER — Encounter: Admit: 2020-07-06 | Discharge: 2020-07-06 | Payer: MEDICARE

## 2020-07-06 LAB — IRON + BINDING CAPACITY + %SAT+ FERRITIN
Lab: 273 ug/dL (ref 270–380)
Lab: 47 ng/mL (ref 30–300)
Lab: 56 ug/dL (ref 50–185)

## 2020-07-06 LAB — COVID-19 (SARS-COV-2) PCR

## 2020-07-06 LAB — POC GLUCOSE: Lab: 93 mg/dL (ref 70–100)

## 2020-07-06 MED ORDER — XIFAXAN 550 MG PO TAB
550 mg | ORAL_TABLET | Freq: Two times a day (BID) | ORAL | 0 refills | 30.00000 days | Status: AC
Start: 2020-07-06 — End: ?

## 2020-07-06 MED ORDER — ENOXAPARIN 40 MG/0.4 ML SC SYRG
40 mg | Freq: Every day | SUBCUTANEOUS | 0 refills | Status: DC
Start: 2020-07-06 — End: 2020-07-07
  Administered 2020-07-07: 03:00:00 40 mg via SUBCUTANEOUS

## 2020-07-06 NOTE — ED Notes
Report attempted x2 w no success.

## 2020-07-06 NOTE — ED Notes
Report given from [place], [place]. Pt care assumed at this time. Pt currently in ultrasound.

## 2020-07-06 NOTE — ED Notes
Pt returned from ultrasound. Pt resting in bed, VSS, no physical signs of pain at this time. Medication not available to administer enema at this time. Will continue to monitor.

## 2020-07-06 NOTE — ED Notes
ED pharmacist, Edwyna Shell consulted about missing Lactulose enema. Pharmacist looking into this issue now to resolve. Will complete enema when medication is available.

## 2020-07-06 NOTE — ED Notes
Report attempted x1 w no answer

## 2020-07-07 MED ORDER — LACTULOSE 10 GRAM/15 ML PO SOLN
20 g | Freq: Three times a day (TID) | ORAL | 1 refills | 21.00000 days | Status: AC | PRN
Start: 2020-07-07 — End: ?

## 2020-07-08 ENCOUNTER — Encounter: Admit: 2020-07-08 | Discharge: 2020-07-08 | Payer: MEDICARE

## 2020-07-08 NOTE — Telephone Encounter
Contacted patient to let him know [employer] patient assistance application approved x3 months. Patient verbalized understanding and will contact clinic if he has not received shipment within 5 days.

## 2020-07-19 ENCOUNTER — Encounter: Admit: 2020-07-19 | Discharge: 2020-07-19 | Payer: MEDICARE

## 2020-08-13 ENCOUNTER — Encounter: Admit: 2020-08-13 | Discharge: 2020-08-13 | Payer: MEDICARE

## 2020-08-15 ENCOUNTER — Encounter: Admit: 2020-08-15 | Discharge: 2020-08-15 | Payer: MEDICARE

## 2020-08-15 NOTE — Telephone Encounter
Patient's wife Gigi Gin paged GI on call to seemingly report high ammonia and confusion. I called the provided number 3 times and left 2 messages, as well as calling patient's own primary number, but there was no answer.     During my last message, I left instructions to page the [place] operator again if she needs further assistance.

## 2020-08-23 ENCOUNTER — Encounter: Admit: 2020-08-23 | Discharge: 2020-08-23 | Payer: MEDICARE

## 2020-08-23 MED ORDER — DOXYCYCLINE MONOHYDRATE 100 MG PO CAP
ORAL_CAPSULE | Freq: Two times a day (BID) | 3 refills
Start: 2020-08-23 — End: ?

## 2020-08-28 ENCOUNTER — Encounter: Admit: 2020-08-28 | Discharge: 2020-08-28 | Payer: MEDICARE

## 2020-08-28 ENCOUNTER — Inpatient Hospital Stay: Admit: 2020-08-28 | Payer: MEDICARE

## 2020-08-28 DIAGNOSIS — T300 Burn of unspecified body region, unspecified degree: Secondary | ICD-10-CM

## 2020-08-28 MED ORDER — RIFAXIMIN 550 MG PO TAB
550 mg | Freq: Two times a day (BID) | ORAL | 0 refills | Status: AC
Start: 2020-08-28 — End: ?
  Administered 2020-08-29 – 2020-09-08 (×22): 550 mg via ORAL

## 2020-08-28 MED ORDER — OXYCODONE 5 MG PO TAB
5 mg | ORAL | 0 refills | Status: AC | PRN
Start: 2020-08-28 — End: ?
  Administered 2020-08-29 – 2020-09-06 (×16): 5 mg via ORAL

## 2020-08-28 MED ORDER — ZOLPIDEM 5 MG PO TAB
5 mg | Freq: Every evening | ORAL | 0 refills | Status: DC | PRN
Start: 2020-08-28 — End: 2020-08-29
  Administered 2020-08-29: 03:00:00 5 mg via ORAL

## 2020-08-28 MED ORDER — LACTULOSE 10 GRAM/15 ML PO LIQUID GROUP
20 g | Freq: Three times a day (TID) | ORAL | 0 refills | Status: AC
Start: 2020-08-28 — End: ?
  Administered 2020-08-29 – 2020-09-01 (×9): 20 g via ORAL

## 2020-08-28 MED ORDER — FENTANYL CITRATE (PF) 50 MCG/ML IJ SOLN
25-200 ug | Freq: Every day | INTRAVENOUS | 0 refills | Status: AC | PRN
Start: 2020-08-28 — End: ?
  Administered 2020-08-29 (×2): 25 ug via INTRAVENOUS
  Administered 2020-08-29: 14:00:00 50 ug via INTRAVENOUS
  Administered 2020-08-31: 20:00:00 25 ug via INTRAVENOUS
  Administered 2020-08-31: 20:00:00 50 ug via INTRAVENOUS
  Administered 2020-09-03 – 2020-09-04 (×3): 25 ug via INTRAVENOUS

## 2020-08-28 MED ORDER — ENOXAPARIN 30 MG/0.3 ML SC SYRG
30 mg | Freq: Two times a day (BID) | SUBCUTANEOUS | 0 refills | Status: AC
Start: 2020-08-28 — End: ?
  Administered 2020-08-29 – 2020-09-07 (×14): 30 mg via SUBCUTANEOUS

## 2020-08-28 MED ORDER — PANTOPRAZOLE 20 MG PO TBEC
40 mg | Freq: Two times a day (BID) | ORAL | 0 refills | Status: AC
Start: 2020-08-28 — End: ?
  Administered 2020-08-29 – 2020-09-08 (×19): 40 mg via ORAL

## 2020-08-28 MED ORDER — ZOLPIDEM 5 MG PO TAB
10 mg | Freq: Every evening | ORAL | 0 refills | Status: AC | PRN
Start: 2020-08-28 — End: ?
  Administered 2020-08-29: 03:00:00 5 mg via ORAL
  Administered 2020-08-30 – 2020-09-02 (×3): 10 mg via ORAL

## 2020-08-28 MED ORDER — ONDANSETRON HCL (PF) 4 MG/2 ML IJ SOLN
4 mg | INTRAVENOUS | 0 refills | Status: AC | PRN
Start: 2020-08-28 — End: ?
  Administered 2020-09-05 – 2020-09-07 (×2): 4 mg via INTRAVENOUS

## 2020-08-28 MED ADMIN — FENTANYL CITRATE (PF) 50 MCG/ML IJ SOLN [3037]: 50 ug | INTRAVENOUS | Stop: 2020-08-28 | NDC 63323080612
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
## 2020-08-29 ENCOUNTER — Encounter: Admit: 2020-08-29 | Discharge: 2020-08-29 | Payer: MEDICARE

## 2020-08-29 ENCOUNTER — Inpatient Hospital Stay: Admit: 2020-08-29 | Discharge: 2020-08-29 | Payer: MEDICARE

## 2020-08-29 DIAGNOSIS — R7881 Bacteremia: Secondary | ICD-10-CM

## 2020-08-29 DIAGNOSIS — F1011 Alcohol abuse, in remission: Secondary | ICD-10-CM

## 2020-08-29 DIAGNOSIS — M1990 Unspecified osteoarthritis, unspecified site: Secondary | ICD-10-CM

## 2020-08-29 DIAGNOSIS — K7290 Hepatic failure, unspecified without coma: Secondary | ICD-10-CM

## 2020-08-29 DIAGNOSIS — E43 Unspecified severe protein-calorie malnutrition: Secondary | ICD-10-CM

## 2020-08-29 DIAGNOSIS — M009 Pyogenic arthritis, unspecified: Secondary | ICD-10-CM

## 2020-08-29 LAB — COMPREHENSIVE METABOLIC PANEL
Lab: 0.6 mg/dL (ref 0.4–1.24)
Lab: 12 U/L (ref 7–56)
Lab: 145 MMOL/L (ref 137–147)
Lab: 17 MMOL/L — ABNORMAL LOW (ref 21–30)
Lab: 2.4 g/dL — ABNORMAL LOW (ref 3.5–5.0)
Lab: 22 mg/dL (ref 7–25)
Lab: 31 U/L (ref 7–40)
Lab: 4 g/dL — ABNORMAL LOW (ref 6.0–8.0)
Lab: 4.7 mg/dL — ABNORMAL HIGH (ref 0.3–1.2)
Lab: 7.1 mg/dL — ABNORMAL LOW (ref 8.5–10.6)
Lab: 79 U/L (ref 25–110)
Lab: >60 mL/min (ref >60)
Lab: >60 mL/min (ref >60)
Lab: 6 (ref 3–12)

## 2020-08-29 LAB — AMMONIA: Lab: 62 umol/L — ABNORMAL HIGH (ref 9–35)

## 2020-08-29 LAB — PREALBUMIN: Lab: 9 mg/dL — ABNORMAL LOW (ref 17–34)

## 2020-08-29 LAB — CBC
Lab: 18 % — ABNORMAL HIGH (ref 11–15)
Lab: 3.6 M/UL — ABNORMAL LOW (ref 4.4–5.5)
Lab: 33 pg (ref 26–34)
Lab: 34 % — ABNORMAL LOW (ref 40–50)
Lab: 35 g/dL (ref 32.0–36.0)
Lab: 6.9 10*3/uL (ref 4.5–11.0)
Lab: 7.8 FL (ref 7–11)
Lab: 70 10*3/uL — ABNORMAL LOW (ref 150–400)
Lab: 94 FL (ref 80–100)

## 2020-08-29 LAB — PHOSPHORUS: Lab: 3.5 mg/dL — ABNORMAL HIGH (ref 2.0–4.5)

## 2020-08-29 LAB — MAGNESIUM: Lab: 1.5 mg/dL — ABNORMAL LOW (ref 1.6–2.6)

## 2020-08-29 MED ADMIN — SODIUM CHLORIDE 0.9 % IV SOLP [27838]: 2 g | INTRAVENOUS | @ 09:00:00 | Stop: 2020-08-29 | NDC 00338004938

## 2020-08-29 MED ADMIN — IBUPROFEN 600 MG PO TAB [3844]: 600 mg | ORAL | @ 16:00:00 | NDC 00904585461

## 2020-08-29 MED ADMIN — BACITRACIN ZINC 500 UNIT/GRAM TP OINT [13818]: TOPICAL | @ 01:00:00 | Stop: 2020-08-29 | NDC 00536126328

## 2020-08-29 MED ADMIN — MULTIVIT-IRON-FA-CALCIUM-MINS 9 MG IRON-400 MCG PO TAB [172795]: 1 | ORAL | @ 21:00:00 | NDC 00536466110

## 2020-08-29 MED ADMIN — BACITRACIN ZINC 500 UNIT/GRAM TP OINT [13818]: TOPICAL | Stop: 2020-08-29 | NDC 00536126328

## 2020-08-29 MED ADMIN — FAMOTIDINE 20 MG PO TAB [10011]: 20 mg | ORAL | @ 16:00:00 | NDC 63739064510

## 2020-08-29 MED ADMIN — MAGNESIUM SULFATE IN D5W 1 GRAM/100 ML IV PGBK [166578]: 1 g | INTRAVENOUS | @ 09:00:00 | Stop: 2020-08-29 | NDC 00409672723

## 2020-08-29 MED ADMIN — CALCIUM GLUCONATE 100 MG/ML (10%) IV SOLN [1312]: 2 g | INTRAVENOUS | @ 09:00:00 | Stop: 2020-08-29 | NDC 63323036001

## 2020-08-29 MED ADMIN — BACITRACIN ZINC 500 UNIT/GRAM TP OINT [13818]: TOPICAL | @ 13:00:00 | Stop: 2020-08-29 | NDC 16784011761

## 2020-08-29 MED ADMIN — SODIUM CHLORIDE 0.9 % IV SOLP [27838]: 250 mL | INTRAVENOUS | @ 09:00:00 | Stop: 2020-08-29 | NDC 00338004902
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
## 2020-08-30 ENCOUNTER — Inpatient Hospital Stay: Admit: 2020-08-30 | Discharge: 2020-08-30 | Payer: MEDICARE

## 2020-08-30 ENCOUNTER — Encounter: Admit: 2020-08-30 | Discharge: 2020-08-30 | Payer: MEDICARE

## 2020-08-30 MED ORDER — SUGAMMADEX 100 MG/ML IV SOLN
INTRAVENOUS | 0 refills | Status: DC
Start: 2020-08-30 — End: 2020-08-30
  Administered 2020-08-30: 17:00:00 150 mg via INTRAVENOUS

## 2020-08-30 MED ORDER — PHENYLEPHRINE HCL IN 0.9% NACL 1 MG/10 ML (100 MCG/ML) IV SYRG
INTRAVENOUS | 0 refills | Status: DC
Start: 2020-08-30 — End: 2020-08-30
  Administered 2020-08-30: 16:00:00 150 ug via INTRAVENOUS
  Administered 2020-08-30: 17:00:00 100 ug via INTRAVENOUS
  Administered 2020-08-30: 16:00:00 150 ug via INTRAVENOUS

## 2020-08-30 MED ORDER — PHENYLEPHRINE 40 MCG/ML IN NS IV DRIP (STD CONC)
INTRAVENOUS | 0 refills | Status: DC
Start: 2020-08-30 — End: 2020-08-30
  Administered 2020-08-30 (×2): .5 ug/kg/min via INTRAVENOUS

## 2020-08-30 MED ORDER — FENTANYL CITRATE (PF) 50 MCG/ML IJ SOLN
INTRAVENOUS | 0 refills | Status: DC
Start: 2020-08-30 — End: 2020-08-30
  Administered 2020-08-30: 17:00:00 50 ug via INTRAVENOUS

## 2020-08-30 MED ORDER — ROCURONIUM 10 MG/ML IV SOLN
INTRAVENOUS | 0 refills | Status: DC
Start: 2020-08-30 — End: 2020-08-30
  Administered 2020-08-30: 16:00:00 30 mg via INTRAVENOUS

## 2020-08-30 MED ORDER — ARTIFICIAL TEARS SINGLE DOSE DROPS GROUP
OPHTHALMIC | 0 refills | Status: DC
Start: 2020-08-30 — End: 2020-08-30
  Administered 2020-08-30: 16:00:00 2 [drp] via OPHTHALMIC

## 2020-08-30 MED ORDER — ONDANSETRON HCL (PF) 4 MG/2 ML IJ SOLN
INTRAVENOUS | 0 refills | Status: DC
Start: 2020-08-30 — End: 2020-08-30
  Administered 2020-08-30: 16:00:00 4 mg via INTRAVENOUS

## 2020-08-30 MED ORDER — LIDOCAINE (PF) 20 MG/ML (2 %) IJ SOLN
INTRAVENOUS | 0 refills | Status: DC
Start: 2020-08-30 — End: 2020-08-30
  Administered 2020-08-30: 16:00:00 60 mg via INTRAVENOUS

## 2020-08-30 MED ORDER — DEXAMETHASONE SODIUM PHOSPHATE 4 MG/ML IJ SOLN
INTRAVENOUS | 0 refills | Status: DC
Start: 2020-08-30 — End: 2020-08-30
  Administered 2020-08-30: 16:00:00 4 mg via INTRAVENOUS

## 2020-08-30 MED ORDER — PROPOFOL INJ 10 MG/ML IV VIAL
INTRAVENOUS | 0 refills | Status: DC
Start: 2020-08-30 — End: 2020-08-30
  Administered 2020-08-30: 17:00:00 20 mg via INTRAVENOUS
  Administered 2020-08-30: 16:00:00 100 mg via INTRAVENOUS

## 2020-08-30 MED ORDER — SODIUM CHLORIDE 0.9 % IV SOLP
INTRAVENOUS | 0 refills | Status: DC
Start: 2020-08-30 — End: 2020-08-30
  Administered 2020-08-30: 16:00:00 via INTRAVENOUS

## 2020-08-30 MED ORDER — MIDAZOLAM 1 MG/ML IJ SOLN
INTRAVENOUS | 0 refills | Status: DC
Start: 2020-08-30 — End: 2020-08-30
  Administered 2020-08-30: 16:00:00 1 mg via INTRAVENOUS

## 2020-08-30 MED ADMIN — EPINEPHRINE 1 MG/ML IJ SOLN [2850]: 2000 mL | @ 15:00:00 | Stop: 2020-08-30 | NDC 42023016801

## 2020-08-30 MED ADMIN — MULTIVIT-IRON-FA-CALCIUM-MINS 9 MG IRON-400 MCG PO TAB [172795]: 1 | ORAL | @ 19:00:00 | NDC 00536466110

## 2020-08-30 MED ADMIN — CARVEDILOL 6.25 MG PO TAB [77309]: 6.25 mg | ORAL | @ 02:00:00 | NDC 00904630161

## 2020-08-30 MED ADMIN — IBUPROFEN 600 MG PO TAB [3844]: 600 mg | ORAL | @ 23:00:00 | NDC 00904585461

## 2020-08-30 MED ADMIN — SODIUM CHLORIDE 0.9 % IR SOLN [11403]: 2000 mL | @ 15:00:00 | Stop: 2020-08-30 | NDC 00338004804

## 2020-08-30 MED ADMIN — THROMBIN (BOVINE) 20,000 UNIT TP SPRY [79532]: 1 | TOPICAL | @ 15:00:00 | Stop: 2020-08-30 | NDC 60793021721

## 2020-08-30 MED ADMIN — FENTANYL CITRATE (PF) 50 MCG/ML IJ SOLN [3037]: 50 ug | INTRAVENOUS | @ 18:00:00 | Stop: 2020-08-30 | NDC 63323080612

## 2020-08-30 MED ADMIN — IBUPROFEN 600 MG PO TAB [3844]: 600 mg | ORAL | @ 14:00:00 | NDC 00904585461

## 2020-08-30 MED ADMIN — FENTANYL CITRATE (PF) 50 MCG/ML IJ SOLN [3037]: 50 ug | INTRAVENOUS | @ 22:00:00 | Stop: 2020-08-30 | NDC 63323080612

## 2020-08-30 MED ADMIN — MAGNESIUM SULFATE IN D5W 1 GRAM/100 ML IV PGBK [166578]: 1 g | INTRAVENOUS | @ 13:00:00 | Stop: 2020-08-30 | NDC 00409672723

## 2020-08-30 MED ADMIN — CARVEDILOL 6.25 MG PO TAB [77309]: 6.25 mg | ORAL | @ 13:00:00 | NDC 00904630161

## 2020-08-30 MED ADMIN — FAMOTIDINE 20 MG PO TAB [10011]: 20 mg | ORAL | @ 02:00:00 | NDC 63739064510
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
## 2020-08-31 ENCOUNTER — Inpatient Hospital Stay: Admit: 2020-08-31 | Discharge: 2020-08-31 | Payer: MEDICARE

## 2020-08-31 MED ADMIN — CARVEDILOL 6.25 MG PO TAB [77309]: 6.25 mg | ORAL | @ 03:00:00 | NDC 00904630161

## 2020-08-31 MED ADMIN — MULTIVIT-IRON-FA-CALCIUM-MINS 9 MG IRON-400 MCG PO TAB [172795]: 1 | ORAL | @ 14:00:00 | NDC 00536466110

## 2020-08-31 MED ADMIN — LACTATED RINGERS IV SOLP [4318]: 1000.000 mL | INTRAVENOUS | Stop: 2020-09-02 | NDC 00338011704

## 2020-08-31 MED ADMIN — DOXYCYCLINE HYCLATE 100 MG PO TAB [2625]: 100 mg | ORAL | @ 14:00:00 | NDC 00904043004

## 2020-08-31 MED ADMIN — LACTATED RINGERS IV SOLP [4318]: 1000.000 mL | INTRAVENOUS | @ 16:00:00 | Stop: 2020-09-01 | NDC 00338011704

## 2020-08-31 MED ADMIN — DOXYCYCLINE HYCLATE 100 MG PO TAB [2625]: 100 mg | ORAL | @ 03:00:00 | NDC 00904043004

## 2020-08-31 MED ADMIN — CARVEDILOL 6.25 MG PO TAB [77309]: 6.25 mg | ORAL | @ 14:00:00 | NDC 00904630161

## 2020-09-01 ENCOUNTER — Encounter: Admit: 2020-09-01 | Discharge: 2020-09-01 | Payer: MEDICARE

## 2020-09-01 DIAGNOSIS — M1990 Unspecified osteoarthritis, unspecified site: Secondary | ICD-10-CM

## 2020-09-01 DIAGNOSIS — K7290 Hepatic failure, unspecified without coma: Secondary | ICD-10-CM

## 2020-09-01 DIAGNOSIS — M009 Pyogenic arthritis, unspecified: Secondary | ICD-10-CM

## 2020-09-01 DIAGNOSIS — R7881 Bacteremia: Secondary | ICD-10-CM

## 2020-09-01 DIAGNOSIS — E43 Unspecified severe protein-calorie malnutrition: Secondary | ICD-10-CM

## 2020-09-01 DIAGNOSIS — F1011 Alcohol abuse, in remission: Secondary | ICD-10-CM

## 2020-09-01 MED ADMIN — CARVEDILOL 6.25 MG PO TAB [77309]: 6.25 mg | ORAL | @ 13:00:00 | NDC 00904630161

## 2020-09-01 MED ADMIN — LACTATED RINGERS IV SOLP [4318]: 1000.000 mL | INTRAVENOUS | @ 07:00:00 | Stop: 2020-09-01 | NDC 00338011704

## 2020-09-01 MED ADMIN — MULTIVIT-IRON-FA-CALCIUM-MINS 9 MG IRON-400 MCG PO TAB [172795]: 1 | ORAL | @ 13:00:00 | NDC 00536466110

## 2020-09-01 MED ADMIN — LACTATED RINGERS IV SOLP [4318]: 1000.000 mL | INTRAVENOUS | @ 15:00:00 | Stop: 2020-09-01 | NDC 00338011704

## 2020-09-01 MED ADMIN — DOXYCYCLINE HYCLATE 100 MG PO TAB [2625]: 100 mg | ORAL | @ 02:00:00 | NDC 00904043004

## 2020-09-01 MED ADMIN — LACTULOSE 10 GRAM/15 ML PO LIQUID GROUP [280001]: 20 g | ORAL | @ 13:00:00 | NDC 00121115430

## 2020-09-01 MED ADMIN — LACTULOSE 10 GRAM/15 ML PO LIQUID GROUP [280001]: 20 g | ORAL | @ 02:00:00 | NDC 00121115430

## 2020-09-01 MED ADMIN — LACTULOSE 10 GRAM/15 ML PO LIQUID GROUP [280001]: 20 g | ORAL | @ 21:00:00 | NDC 00121115430

## 2020-09-01 MED ADMIN — DOXYCYCLINE HYCLATE 100 MG PO TAB [2625]: 100 mg | ORAL | @ 13:00:00 | NDC 00904043004

## 2020-09-02 ENCOUNTER — Encounter: Admit: 2020-09-02 | Discharge: 2020-09-02 | Payer: MEDICARE

## 2020-09-02 ENCOUNTER — Inpatient Hospital Stay: Admit: 2020-09-02 | Discharge: 2020-09-02 | Payer: MEDICARE

## 2020-09-02 MED ADMIN — CARVEDILOL 6.25 MG PO TAB [77309]: 6.25 mg | ORAL | @ 01:00:00 | NDC 00904630161

## 2020-09-02 MED ADMIN — LACTULOSE 10 GRAM/15 ML PO LIQUID GROUP [280001]: 20 g | ORAL | @ 14:00:00 | NDC 00121115430

## 2020-09-02 MED ADMIN — LACTULOSE 10 GRAM/15 ML PO LIQUID GROUP [280001]: 20 g | ORAL | @ 21:00:00 | NDC 00121115430

## 2020-09-02 MED ADMIN — IOHEXOL 350 MG IODINE/ML IV SOLN [81210]: 60 mL | INTRAVENOUS | @ 20:00:00 | Stop: 2020-09-02 | NDC 00407141491

## 2020-09-02 MED ADMIN — ALBUTEROL SULFATE 2.5 MG /3 ML (0.083 %) IN NEBU [250]: 2.5 mg | RESPIRATORY_TRACT | @ 19:00:00 | Stop: 2020-09-02 | NDC 00487950101

## 2020-09-02 MED ADMIN — IPRATROPIUM-ALBUTEROL 0.5 MG-3 MG(2.5 MG BASE)/3 ML IN NEBU [77459]: 3 mL | RESPIRATORY_TRACT | @ 19:00:00 | Stop: 2020-09-02 | NDC 69097084034

## 2020-09-02 MED ADMIN — DOXYCYCLINE HYCLATE 100 MG PO TAB [2625]: 100 mg | ORAL | @ 14:00:00 | NDC 00904043004

## 2020-09-02 MED ADMIN — CARVEDILOL 6.25 MG PO TAB [77309]: 6.25 mg | ORAL | @ 14:00:00 | NDC 00904630161

## 2020-09-02 MED ADMIN — GABAPENTIN 300 MG PO CAP [18308]: 300 mg | ORAL | @ 19:00:00 | NDC 00904666661

## 2020-09-02 MED ADMIN — DOXYCYCLINE HYCLATE 100 MG PO TAB [2625]: 100 mg | ORAL | @ 01:00:00 | NDC 00904043004

## 2020-09-03 ENCOUNTER — Encounter: Admit: 2020-09-03 | Discharge: 2020-09-03 | Payer: MEDICARE

## 2020-09-03 ENCOUNTER — Inpatient Hospital Stay: Admit: 2020-09-03 | Discharge: 2020-09-03 | Payer: MEDICARE

## 2020-09-03 DIAGNOSIS — F1011 Alcohol abuse, in remission: Secondary | ICD-10-CM

## 2020-09-03 DIAGNOSIS — E43 Unspecified severe protein-calorie malnutrition: Secondary | ICD-10-CM

## 2020-09-03 DIAGNOSIS — K7290 Hepatic failure, unspecified without coma: Secondary | ICD-10-CM

## 2020-09-03 DIAGNOSIS — M009 Pyogenic arthritis, unspecified: Secondary | ICD-10-CM

## 2020-09-03 DIAGNOSIS — M1990 Unspecified osteoarthritis, unspecified site: Secondary | ICD-10-CM

## 2020-09-03 DIAGNOSIS — R7881 Bacteremia: Secondary | ICD-10-CM

## 2020-09-03 MED ORDER — FENTANYL CITRATE (PF) 50 MCG/ML IJ SOLN
INTRAVENOUS | 0 refills | Status: DC
Start: 2020-09-03 — End: 2020-09-03
  Administered 2020-09-03: 18:00:00 25 ug via INTRAVENOUS

## 2020-09-03 MED ORDER — MIDAZOLAM 1 MG/ML IJ SOLN
INTRAVENOUS | 0 refills | Status: DC
Start: 2020-09-03 — End: 2020-09-03
  Administered 2020-09-03: 17:00:00 1 mg via INTRAVENOUS

## 2020-09-03 MED ORDER — LACTATED RINGERS IV SOLP
INTRAVENOUS | 0 refills | Status: DC
Start: 2020-09-03 — End: 2020-09-03
  Administered 2020-09-03: 17:00:00 via INTRAVENOUS

## 2020-09-03 MED ORDER — PROPOFOL INJ 10 MG/ML IV VIAL
INTRAVENOUS | 0 refills | Status: DC
Start: 2020-09-03 — End: 2020-09-03
  Administered 2020-09-03 (×2): 20 mg via INTRAVENOUS

## 2020-09-03 MED ORDER — EPHEDRINE SULFATE 50 MG/5ML SYR (10 MG/ML) (AN)(OSM)
INTRAVENOUS | 0 refills | Status: DC
Start: 2020-09-03 — End: 2020-09-03
  Administered 2020-09-03 (×2): 10 mg via INTRAVENOUS

## 2020-09-03 MED ORDER — PROPOFOL 10 MG/ML IV EMUL 50 ML (INFUSION)(AM)(OR)
INTRAVENOUS | 0 refills | Status: DC
Start: 2020-09-03 — End: 2020-09-03
  Administered 2020-09-03: 17:00:00 30 ug/kg/min via INTRAVENOUS

## 2020-09-03 MED ORDER — PHENYLEPHRINE HCL IN 0.9% NACL 1 MG/10 ML (100 MCG/ML) IV SYRG
INTRAVENOUS | 0 refills | Status: DC
Start: 2020-09-03 — End: 2020-09-03
  Administered 2020-09-03: 18:00:00 200 ug via INTRAVENOUS
  Administered 2020-09-03 (×2): 100 ug via INTRAVENOUS

## 2020-09-03 MED ADMIN — SODIUM CHLORIDE 0.9 % IR SOLN [11403]: 1000.000 mL | @ 23:00:00 | NDC 00338004804

## 2020-09-03 MED ADMIN — GABAPENTIN 300 MG PO CAP [18308]: 300 mg | ORAL | @ 02:00:00 | NDC 00904666661

## 2020-09-03 MED ADMIN — CARVEDILOL 6.25 MG PO TAB [77309]: 6.25 mg | ORAL | @ 14:00:00 | NDC 00904630161

## 2020-09-03 MED ADMIN — CARVEDILOL 6.25 MG PO TAB [77309]: 6.25 mg | ORAL | @ 02:00:00 | NDC 00904630161

## 2020-09-03 MED ADMIN — OXYCODONE 5 MG PO TAB [10814]: 5 mg | ORAL | @ 19:00:00 | Stop: 2020-09-03 | NDC 00406055223

## 2020-09-03 MED ADMIN — FENTANYL CITRATE (PF) 50 MCG/ML IJ SOLN [3037]: 25 ug | INTRAVENOUS | @ 19:00:00 | Stop: 2020-09-03 | NDC 00641602701

## 2020-09-03 MED ADMIN — GABAPENTIN 300 MG PO CAP [18308]: 300 mg | ORAL | @ 09:00:00 | NDC 00904666661

## 2020-09-03 MED ADMIN — GENTAMICIN 40 MG/ML IJ SOLN [3426]: 1000.000 mL | @ 23:00:00 | NDC 63323001020

## 2020-09-03 MED ADMIN — DOXYCYCLINE HYCLATE 100 MG PO TAB [2625]: 100 mg | ORAL | @ 02:00:00 | NDC 00904043004

## 2020-09-03 MED ADMIN — SODIUM CHLORIDE 0.9 % IR SOLN [11403]: 1000.000 mL | @ 20:00:00 | NDC 00338004804

## 2020-09-03 MED ADMIN — LACTULOSE 10 GRAM/15 ML PO LIQUID GROUP [280001]: 20 g | ORAL | @ 02:00:00 | NDC 00121115430

## 2020-09-03 MED ADMIN — GENTAMICIN 40 MG/ML IJ SOLN [3426]: 1000.000 mL | @ 20:00:00 | NDC 63323001020

## 2020-09-03 MED ADMIN — GABAPENTIN 300 MG PO CAP [18308]: 300 mg | ORAL | @ 19:00:00 | NDC 63739023610

## 2020-09-03 MED ADMIN — DOXYCYCLINE HYCLATE 100 MG PO TAB [2625]: 100 mg | ORAL | @ 15:00:00 | NDC 00904043004

## 2020-09-03 MED ADMIN — POLYMYXIN B SULFATE 500,000 UNIT IJ SOLR [6393]: 1000.000 mL | @ 23:00:00 | NDC 70594004901

## 2020-09-03 MED ADMIN — POLYMYXIN B SULFATE 500,000 UNIT IJ SOLR [6393]: 1000.000 mL | @ 20:00:00 | NDC 70594004901

## 2020-09-03 MED ADMIN — LACTULOSE 10 GRAM/15 ML PO LIQUID GROUP [280001]: 20 g | ORAL | @ 19:00:00 | NDC 00121115430
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
## 2020-09-04 MED ADMIN — GABAPENTIN 300 MG PO CAP [18308]: 300 mg | ORAL | @ 01:00:00 | NDC 00904666661

## 2020-09-04 MED ADMIN — CARVEDILOL 6.25 MG PO TAB [77309]: 6.25 mg | ORAL | @ 01:00:00 | NDC 00904630161

## 2020-09-04 MED ADMIN — POLYMYXIN B SULFATE 500,000 UNIT IJ SOLR [6393]: 1000.000 mL | @ 23:00:00 | NDC 70594004901

## 2020-09-04 MED ADMIN — GENTAMICIN 40 MG/ML IJ SOLN [3426]: 1000.000 mL | @ 23:00:00 | NDC 63323001020

## 2020-09-04 MED ADMIN — DOXYCYCLINE HYCLATE 100 MG PO TAB [2625]: 100 mg | ORAL | @ 01:00:00 | NDC 00904043004

## 2020-09-04 MED ADMIN — MULTIVIT-IRON-FA-CALCIUM-MINS 9 MG IRON-400 MCG PO TAB [172795]: 1 | ORAL | @ 13:00:00 | NDC 00536466110

## 2020-09-04 MED ADMIN — DOXYCYCLINE HYCLATE 100 MG PO TAB [2625]: 100 mg | ORAL | @ 13:00:00 | NDC 00904043004

## 2020-09-04 MED ADMIN — CARVEDILOL 6.25 MG PO TAB [77309]: 6.25 mg | ORAL | @ 13:00:00 | NDC 00904630161

## 2020-09-04 MED ADMIN — GENTAMICIN 40 MG/ML IJ SOLN [3426]: 1000.000 mL | @ 05:00:00 | NDC 63323001020

## 2020-09-04 MED ADMIN — POLYMYXIN B SULFATE 500,000 UNIT IJ SOLR [6393]: 1000.000 mL | @ 05:00:00 | NDC 70594004901

## 2020-09-04 MED ADMIN — SODIUM CHLORIDE 0.9 % IR SOLN [11403]: 1000.000 mL | @ 17:00:00 | NDC 00338004804

## 2020-09-04 MED ADMIN — LACTULOSE 10 GRAM/15 ML PO LIQUID GROUP [280001]: 20 g | ORAL | @ 13:00:00 | NDC 00121115430

## 2020-09-04 MED ADMIN — GABAPENTIN 300 MG PO CAP [18308]: 300 mg | ORAL | @ 11:00:00 | NDC 63739023610

## 2020-09-04 MED ADMIN — POLYMYXIN B SULFATE 500,000 UNIT IJ SOLR [6393]: 1000.000 mL | @ 11:00:00 | NDC 70594004901

## 2020-09-04 MED ADMIN — GABAPENTIN 300 MG PO CAP [18308]: 300 mg | ORAL | @ 19:00:00 | NDC 00904666661

## 2020-09-04 MED ADMIN — SODIUM CHLORIDE 0.9 % IR SOLN [11403]: 1000.000 mL | @ 11:00:00 | NDC 00338004804

## 2020-09-04 MED ADMIN — POLYMYXIN B SULFATE 500,000 UNIT IJ SOLR [6393]: 1000.000 mL | @ 17:00:00 | NDC 70594004901

## 2020-09-04 MED ADMIN — SODIUM CHLORIDE 0.9 % IR SOLN [11403]: 1000.000 mL | @ 05:00:00 | NDC 00338004804

## 2020-09-04 MED ADMIN — GENTAMICIN 40 MG/ML IJ SOLN [3426]: 1000.000 mL | @ 11:00:00 | NDC 63323001020

## 2020-09-04 MED ADMIN — GENTAMICIN 40 MG/ML IJ SOLN [3426]: 1000.000 mL | @ 17:00:00 | NDC 63323001020

## 2020-09-04 MED ADMIN — SODIUM CHLORIDE 0.9 % IR SOLN [11403]: 1000.000 mL | @ 23:00:00 | NDC 00338004804

## 2020-09-04 MED ADMIN — LACTULOSE 10 GRAM/15 ML PO LIQUID GROUP [280001]: 20 g | ORAL | @ 19:00:00 | NDC 00121115430

## 2020-09-05 ENCOUNTER — Encounter: Admit: 2020-09-05 | Discharge: 2020-09-05 | Payer: MEDICARE

## 2020-09-05 ENCOUNTER — Inpatient Hospital Stay: Admit: 2020-09-05 | Discharge: 2020-09-05 | Payer: MEDICARE

## 2020-09-05 MED ORDER — EPHEDRINE SULFATE 50 MG/ML IV SOLN
INTRAVENOUS | 0 refills | Status: DC
Start: 2020-09-05 — End: 2020-09-05
  Administered 2020-09-05: 14:00:00 10 mg via INTRAVENOUS

## 2020-09-05 MED ORDER — LACTATED RINGERS IV SOLP
INTRAVENOUS | 0 refills | Status: DC
Start: 2020-09-05 — End: 2020-09-05
  Administered 2020-09-05: 13:00:00 via INTRAVENOUS

## 2020-09-05 MED ORDER — PHENYLEPHRINE HCL IN 0.9% NACL 1 MG/10 ML (100 MCG/ML) IV SYRG
INTRAVENOUS | 0 refills | Status: DC
Start: 2020-09-05 — End: 2020-09-05
  Administered 2020-09-05: 14:00:00 200 ug via INTRAVENOUS
  Administered 2020-09-05: 14:00:00 100 ug via INTRAVENOUS
  Administered 2020-09-05: 14:00:00 200 ug via INTRAVENOUS

## 2020-09-05 MED ORDER — ARTIFICIAL TEARS SINGLE DOSE DROPS GROUP
OPHTHALMIC | 0 refills | Status: DC
Start: 2020-09-05 — End: 2020-09-05
  Administered 2020-09-05: 14:00:00 2 [drp] via OPHTHALMIC

## 2020-09-05 MED ORDER — LIDOCAINE (PF) 200 MG/10 ML (2 %) IJ SYRG
INTRAVENOUS | 0 refills | Status: DC
Start: 2020-09-05 — End: 2020-09-05
  Administered 2020-09-05: 14:00:00 80 mg via INTRAVENOUS

## 2020-09-05 MED ORDER — PHENYLEPHRINE 40 MCG/ML IN NS IV DRIP (STD CONC)
INTRAVENOUS | 0 refills | Status: DC
Start: 2020-09-05 — End: 2020-09-05
  Administered 2020-09-05 (×2): .6 ug/kg/min via INTRAVENOUS

## 2020-09-05 MED ORDER — MIDAZOLAM 1 MG/ML IJ SOLN
INTRAVENOUS | 0 refills | Status: DC
Start: 2020-09-05 — End: 2020-09-05
  Administered 2020-09-05 (×2): 1 mg via INTRAVENOUS

## 2020-09-05 MED ORDER — ROCURONIUM 10 MG/ML IV SOLN
INTRAVENOUS | 0 refills | Status: DC
Start: 2020-09-05 — End: 2020-09-05
  Administered 2020-09-05: 14:00:00 30 mg via INTRAVENOUS

## 2020-09-05 MED ORDER — SUGAMMADEX 100 MG/ML IV SOLN
INTRAVENOUS | 0 refills | Status: DC
Start: 2020-09-05 — End: 2020-09-05
  Administered 2020-09-05: 15:00:00 140 mg via INTRAVENOUS

## 2020-09-05 MED ORDER — FENTANYL CITRATE (PF) 50 MCG/ML IJ SOLN
INTRAVENOUS | 0 refills | Status: DC
Start: 2020-09-05 — End: 2020-09-05

## 2020-09-05 MED ORDER — ONDANSETRON HCL (PF) 4 MG/2 ML IJ SOLN
INTRAVENOUS | 0 refills | Status: DC
Start: 2020-09-05 — End: 2020-09-05
  Administered 2020-09-05: 15:00:00 4 mg via INTRAVENOUS

## 2020-09-05 MED ORDER — DEXAMETHASONE SODIUM PHOSPHATE 4 MG/ML IJ SOLN
INTRAVENOUS | 0 refills | Status: DC
Start: 2020-09-05 — End: 2020-09-05
  Administered 2020-09-05: 14:00:00 4 mg via INTRAVENOUS

## 2020-09-05 MED ORDER — PROPOFOL INJ 10 MG/ML IV VIAL
INTRAVENOUS | 0 refills | Status: DC
Start: 2020-09-05 — End: 2020-09-05
  Administered 2020-09-05: 14:00:00 80 mg via INTRAVENOUS

## 2020-09-05 MED ADMIN — FENTANYL CITRATE (PF) 50 MCG/ML IJ SOLN [3037]: 50 ug | INTRAVENOUS | @ 16:00:00 | Stop: 2020-09-05 | NDC 00641602701

## 2020-09-05 MED ADMIN — LACTULOSE 10 GRAM/15 ML PO LIQUID GROUP [280001]: 20 g | ORAL | @ 20:00:00 | NDC 00121115430

## 2020-09-05 MED ADMIN — DOXYCYCLINE HYCLATE 100 MG PO TAB [2625]: 100 mg | ORAL | @ 13:00:00 | NDC 00904043004

## 2020-09-05 MED ADMIN — SODIUM CHLORIDE 0.9 % IR SOLN [11403]: 1000.000 mL | @ 12:00:00 | Stop: 2020-09-05 | NDC 00338004804

## 2020-09-05 MED ADMIN — ALBUTEROL SULFATE 2.5 MG /3 ML (0.083 %) IN NEBU [250]: 2.5 mg | RESPIRATORY_TRACT | @ 18:00:00 | NDC 00487950101

## 2020-09-05 MED ADMIN — GABAPENTIN 300 MG PO CAP [18308]: 300 mg | ORAL | @ 02:00:00 | NDC 00904666661

## 2020-09-05 MED ADMIN — POLYMYXIN B SULFATE 500,000 UNIT IJ SOLR [6393]: 1000.000 mL | @ 12:00:00 | Stop: 2020-09-05 | NDC 70594004901

## 2020-09-05 MED ADMIN — OXYCODONE 5 MG PO TAB [10814]: 5 mg | ORAL | @ 16:00:00 | Stop: 2020-09-05 | NDC 00406055223

## 2020-09-05 MED ADMIN — POLYMYXIN B SULFATE 500,000 UNIT IJ SOLR [6393]: 1000.000 mL | @ 04:00:00 | NDC 70594004901

## 2020-09-05 MED ADMIN — GENTAMICIN 40 MG/ML IJ SOLN [3426]: 1000.000 mL | @ 04:00:00 | NDC 63323001020

## 2020-09-05 MED ADMIN — GENTAMICIN 40 MG/ML IJ SOLN [3426]: 1000.000 mL | @ 12:00:00 | Stop: 2020-09-05 | NDC 63323001020

## 2020-09-05 MED ADMIN — CARVEDILOL 6.25 MG PO TAB [77309]: 6.25 mg | ORAL | @ 13:00:00 | NDC 00904630161

## 2020-09-05 MED ADMIN — GABAPENTIN 300 MG PO CAP [18308]: 300 mg | ORAL | @ 12:00:00 | NDC 00904666661

## 2020-09-05 MED ADMIN — DOXYCYCLINE HYCLATE 100 MG PO TAB [2625]: 100 mg | ORAL | @ 02:00:00 | NDC 00904043004

## 2020-09-05 MED ADMIN — SODIUM CHLORIDE 0.9 % IR SOLN [11403]: 1000.000 mL | @ 04:00:00 | NDC 00338004804

## 2020-09-05 MED ADMIN — LACTULOSE 10 GRAM/15 ML PO LIQUID GROUP [280001]: 20 g | ORAL | @ 22:00:00 | NDC 00121115430

## 2020-09-05 MED ADMIN — CARVEDILOL 6.25 MG PO TAB [77309]: 6.25 mg | ORAL | @ 02:00:00 | NDC 00904630161

## 2020-09-05 MED ADMIN — GABAPENTIN 300 MG PO CAP [18308]: 300 mg | ORAL | @ 20:00:00 | NDC 00904666661
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
## 2020-09-06 ENCOUNTER — Encounter: Admit: 2020-09-06 | Discharge: 2020-09-06 | Payer: MEDICARE

## 2020-09-06 ENCOUNTER — Inpatient Hospital Stay: Admit: 2020-09-06 | Discharge: 2020-09-06 | Payer: MEDICARE

## 2020-09-06 MED ADMIN — NALOXONE 0.4 MG/ML IJ SOLN [5373]: 0.4 mg | INTRAVENOUS | @ 15:00:00 | Stop: 2020-09-06 | NDC 67457029200

## 2020-09-06 MED ADMIN — CARVEDILOL 6.25 MG PO TAB [77309]: 6.25 mg | ORAL | @ 13:00:00 | NDC 00904630161

## 2020-09-06 MED ADMIN — LACTULOSE 10 GRAM/15 ML PO LIQUID GROUP [280001]: 20 g | ORAL | @ 02:00:00 | NDC 00121115430

## 2020-09-06 MED ADMIN — MULTIVIT-IRON-FA-CALCIUM-MINS 9 MG IRON-400 MCG PO TAB [172795]: 1 | ORAL | @ 13:00:00 | NDC 00536466110

## 2020-09-06 MED ADMIN — DOXYCYCLINE HYCLATE 100 MG PO TAB [2625]: 100 mg | ORAL | @ 02:00:00 | NDC 00904043004

## 2020-09-06 MED ADMIN — INSULIN ASPART 100 UNIT/ML SC FLEXPEN [87504]: 2 [IU] | SUBCUTANEOUS | @ 23:00:00 | NDC 00169633910

## 2020-09-06 MED ADMIN — DOXYCYCLINE HYCLATE 100 MG PO TAB [2625]: 100 mg | ORAL | @ 13:00:00 | NDC 00904043004

## 2020-09-06 MED ADMIN — LACTULOSE 10 GRAM/15 ML PO LIQUID GROUP [280001]: 20 g | ORAL | @ 13:00:00 | Stop: 2020-09-06 | NDC 00121115430

## 2020-09-06 MED ADMIN — GABAPENTIN 300 MG PO CAP [18308]: 300 mg | ORAL | @ 02:00:00 | NDC 00904666661

## 2020-09-06 MED ADMIN — CARVEDILOL 6.25 MG PO TAB [77309]: 6.25 mg | ORAL | @ 02:00:00 | NDC 00904630161

## 2020-09-06 MED ADMIN — GABAPENTIN 300 MG PO CAP [18308]: 300 mg | ORAL | @ 10:00:00 | Stop: 2020-09-06 | NDC 00904666661

## 2020-09-06 MED ADMIN — LACTULOSE 10 GRAM/15 ML PO LIQUID GROUP [280001]: 20 g | ORAL | @ 23:00:00 | NDC 00121115430

## 2020-09-06 NOTE — Telephone Encounter
Spoke with Gigi Gin, patient is inpatient and concerned about patient's ammonia level as it is elevated and being treated with lactulose and xifaxin. She has asked for a consult for Hepatology on the inpatient level regarding patient's elevated ammonia/hepatic encephalopathy.

## 2020-09-06 NOTE — Telephone Encounter
Returned call. No answer, LVM.

## 2020-09-06 NOTE — Telephone Encounter
Patients wife called in requesting a call back

## 2020-09-07 ENCOUNTER — Encounter: Admit: 2020-09-07 | Discharge: 2020-09-07 | Payer: MEDICARE

## 2020-09-07 ENCOUNTER — Inpatient Hospital Stay: Admit: 2020-09-07 | Discharge: 2020-09-07 | Payer: MEDICARE

## 2020-09-07 DIAGNOSIS — F1011 Alcohol abuse, in remission: Secondary | ICD-10-CM

## 2020-09-07 DIAGNOSIS — M1990 Unspecified osteoarthritis, unspecified site: Secondary | ICD-10-CM

## 2020-09-07 DIAGNOSIS — E43 Unspecified severe protein-calorie malnutrition: Secondary | ICD-10-CM

## 2020-09-07 DIAGNOSIS — K7290 Hepatic failure, unspecified without coma: Secondary | ICD-10-CM

## 2020-09-07 DIAGNOSIS — M009 Pyogenic arthritis, unspecified: Secondary | ICD-10-CM

## 2020-09-07 DIAGNOSIS — R7881 Bacteremia: Secondary | ICD-10-CM

## 2020-09-07 MED ADMIN — DOXYCYCLINE HYCLATE 100 MG PO TAB [2625]: 100 mg | ORAL | @ 15:00:00 | NDC 00904043004

## 2020-09-07 MED ADMIN — LACTULOSE 10 GRAM/15 ML PO LIQUID GROUP [280001]: 20 g | ORAL | @ 10:00:00 | NDC 00121115430

## 2020-09-07 MED ADMIN — NALOXONE 0.4 MG/ML IJ SOLN [5373]: 0.4 mg | INTRAVENOUS | @ 23:00:00 | Stop: 2020-09-07 | NDC 17478004101

## 2020-09-07 MED ADMIN — LACTULOSE 10 GRAM/15 ML PO LIQUID GROUP [280001]: 20 g | ORAL | @ 05:00:00 | NDC 00121115430

## 2020-09-07 MED ADMIN — LACTULOSE 10 GRAM/15 ML PO LIQUID GROUP [280001]: 20 g | ORAL | @ 18:00:00 | NDC 00121115430

## 2020-09-07 MED ADMIN — TRAMADOL 50 MG PO TAB [14632]: 50 mg | ORAL | @ 07:00:00 | Stop: 2020-09-07 | NDC 51079099101

## 2020-09-07 MED ADMIN — DOXYCYCLINE HYCLATE 100 MG PO TAB [2625]: 100 mg | ORAL | @ 02:00:00 | NDC 00591555305

## 2020-09-07 MED ADMIN — LIDOCAINE HCL 10 MG/ML (1 %) IJ SOLN [4452]: 5 mL | INTRAMUSCULAR | @ 22:00:00 | Stop: 2020-09-07 | NDC 63323048527

## 2020-09-07 MED ADMIN — BUDESONIDE 0.5 MG/2 ML IN NBSP [81378]: 0.5 mg | RESPIRATORY_TRACT | @ 23:00:00 | NDC 00487970101

## 2020-09-07 MED ADMIN — LACTULOSE 10 GRAM/15 ML PO LIQUID GROUP [280001]: 20 g | ORAL | @ 22:00:00 | NDC 00121115430

## 2020-09-07 MED ADMIN — IPRATROPIUM-ALBUTEROL 0.5 MG-3 MG(2.5 MG BASE)/3 ML IN NEBU [77459]: 3 mL | RESPIRATORY_TRACT | @ 04:00:00 | NDC 69097084034

## 2020-09-07 MED ADMIN — ACETAMINOPHEN 160 MG/5 ML PO SOLN [100]: 325 mg | ORAL | @ 22:00:00 | Stop: 2020-09-11 | NDC 00904673971

## 2020-09-07 MED ADMIN — MULTIVIT-IRON-FA-CALCIUM-MINS 9 MG IRON-400 MCG PO TAB [172795]: 1 | ORAL | @ 15:00:00 | NDC 00536466110

## 2020-09-07 MED ADMIN — RACEPINEPHRINE 2.25 % IN NEBU [93138]: 0.5 mL | RESPIRATORY_TRACT | @ 23:00:00 | Stop: 2020-09-07 | NDC 00487590199

## 2020-09-07 MED ADMIN — CARVEDILOL 6.25 MG PO TAB [77309]: 6.25 mg | ORAL | @ 15:00:00 | NDC 00904630161

## 2020-09-07 MED ADMIN — BUDESONIDE 0.5 MG/2 ML IN NBSP [81378]: 0.5 mg | RESPIRATORY_TRACT | @ 02:00:00 | NDC 00487970101

## 2020-09-07 MED ADMIN — BUDESONIDE 0.5 MG/2 ML IN NBSP [81378]: 0.5 mg | RESPIRATORY_TRACT | @ 11:00:00 | NDC 00487970101

## 2020-09-07 MED ADMIN — CARVEDILOL 12.5 MG PO TAB [77424]: 6.25 mg | ORAL | @ 02:00:00 | NDC 00904630261

## 2020-09-07 MED ADMIN — IPRATROPIUM-ALBUTEROL 0.5 MG-3 MG(2.5 MG BASE)/3 ML IN NEBU [77459]: 3 mL | RESPIRATORY_TRACT | @ 23:00:00 | NDC 69097084034

## 2020-09-08 ENCOUNTER — Encounter: Admit: 2020-09-08 | Discharge: 2020-09-08 | Payer: MEDICARE

## 2020-09-08 MED ADMIN — CARVEDILOL 6.25 MG PO TAB [77309]: 6.25 mg | ORAL | @ 15:00:00 | Stop: 2020-09-08 | NDC 00904630161

## 2020-09-08 MED ADMIN — WATER FOR IRRIGATION, STERILE IR SOLN [7485]: TOPICAL | @ 14:00:00 | Stop: 2020-09-08 | NDC 00338000404

## 2020-09-08 MED ADMIN — MAFENIDE ACETATE 50 GRAM TP PACK [82642]: 1000.000 mL | @ 18:00:00 | NDC 49884090252

## 2020-09-08 MED ADMIN — DOXYCYCLINE HYCLATE 100 MG PO TAB [2625]: 100 mg | ORAL | @ 01:00:00 | NDC 00904043004

## 2020-09-08 MED ADMIN — LACTULOSE 10 GRAM/15 ML PO LIQUID GROUP [280001]: 20 g | ORAL | @ 11:00:00 | NDC 00121115430

## 2020-09-08 MED ADMIN — LACTATED RINGERS IV SOLP [4318]: 1000 mL | INTRAVENOUS | @ 15:00:00 | Stop: 2020-09-08 | NDC 00338011704

## 2020-09-08 MED ADMIN — POTASSIUM CHLORIDE IN WATER 10 MEQ/50 ML IV PGBK [11075]: 10 meq | INTRAVENOUS | @ 16:00:00 | Stop: 2020-09-08 | NDC 00338070541

## 2020-09-08 MED ADMIN — FENTANYL CITRATE (PF) 50 MCG/ML IJ SOLN [3037]: 50 ug | INTRAVENOUS | Stop: 2020-09-09 | NDC 00641602701

## 2020-09-08 MED ADMIN — CARVEDILOL 6.25 MG PO TAB [77309]: 6.25 mg | ORAL | @ 01:00:00 | NDC 00904630161

## 2020-09-08 MED ADMIN — MAFENIDE ACETATE 50 GRAM TP PACK [82642]: TOPICAL | @ 14:00:00 | Stop: 2020-09-08 | NDC 49884090252

## 2020-09-08 MED ADMIN — BUDESONIDE 0.5 MG/2 ML IN NBSP [81378]: 0.5 mg | RESPIRATORY_TRACT | @ 11:00:00 | Stop: 2020-09-08 | NDC 00487970101

## 2020-09-08 MED ADMIN — LACTULOSE 10 GRAM/15 ML PO LIQUID GROUP [280001]: 20 g | ORAL | @ 06:00:00 | NDC 00121115430

## 2020-09-08 MED ADMIN — ACETAMINOPHEN 160 MG/5 ML PO SOLN [100]: 325 mg | ORAL | @ 11:00:00 | Stop: 2020-09-08 | NDC 00904673971

## 2020-09-08 MED ADMIN — ALBUMIN, HUMAN 25 % IV SOLP [8981]: 50 g | INTRAVENOUS | @ 15:00:00 | Stop: 2020-09-08 | NDC 44206025110

## 2020-09-08 MED ADMIN — ACETAMINOPHEN 160 MG/5 ML PO SOLN [100]: 325 mg | ORAL | @ 06:00:00 | Stop: 2020-09-08 | NDC 00904673971

## 2020-09-08 MED ADMIN — MULTIVIT-IRON-FA-CALCIUM-MINS 9 MG IRON-400 MCG PO TAB [172795]: 1 | ORAL | @ 15:00:00 | Stop: 2020-09-08 | NDC 00536466110

## 2020-09-08 MED ADMIN — DOXYCYCLINE HYCLATE 100 MG PO TAB [2625]: 100 mg | ORAL | @ 15:00:00 | Stop: 2020-09-08 | NDC 00904043004

## 2020-09-08 MED ADMIN — SODIUM CHLORIDE 0.9 % IV SOLP [27838]: 1000 mL | INTRAVENOUS | @ 15:00:00 | Stop: 2020-09-08 | NDC 00338004904

## 2020-09-08 MED ADMIN — FENTANYL CITRATE (PF) 50 MCG/ML IJ SOLN [3037]: 50 ug | INTRAVENOUS | @ 21:00:00 | Stop: 2020-09-09 | NDC 00409909412

## 2020-09-08 MED ADMIN — WATER FOR IRRIGATION, STERILE IR SOLN [7485]: 1000.000 mL | @ 18:00:00 | NDC 00338000404

## 2020-09-09 MED ADMIN — FENTANYL CITRATE (PF) 50 MCG/ML IJ SOLN [3037]: 50 ug | INTRAVENOUS | @ 21:00:00 | Stop: 2020-09-09 | NDC 63323080612

## 2020-09-09 MED ADMIN — LORAZEPAM 2 MG/ML IJ SOLN [10467]: 1 mg | INTRAVENOUS | @ 21:00:00 | Stop: 2020-09-10 | NDC 00641604401

## 2020-09-09 MED ADMIN — FENTANYL CITRATE (PF) 50 MCG/ML IJ SOLN [3037]: 25 ug | INTRAVENOUS | @ 21:00:00 | Stop: 2020-09-09 | NDC 63323080612

## 2020-09-09 MED ADMIN — FENTANYL CITRATE (PF) 50 MCG/ML IJ SOLN [3037]: 50 ug | INTRAVENOUS | @ 11:00:00 | Stop: 2020-09-09 | NDC 00641602701

## 2020-09-09 MED ADMIN — FENTANYL CITRATE (PF) 50 MCG/ML IJ SOLN [3037]: 75 ug | INTRAVENOUS | @ 21:00:00 | Stop: 2020-09-10 | NDC 63323080612

## 2020-09-09 MED ADMIN — FENTANYL CITRATE (PF) 50 MCG/ML IJ SOLN [3037]: 50 ug | INTRAVENOUS | @ 08:00:00 | Stop: 2020-09-09 | NDC 00641602701

## 2020-09-10 ENCOUNTER — Encounter: Admit: 2020-09-10 | Discharge: 2020-09-10 | Payer: MEDICARE

## 2020-09-10 DEATH — deceased

## 2023-10-23 ENCOUNTER — Encounter: Admit: 2023-10-23 | Discharge: 2023-10-23 | Payer: MEDICARE
